# Patient Record
Sex: Female | Born: 1990 | Race: White | Hispanic: No | Marital: Married | Smoking: Never smoker
Health system: Southern US, Community
[De-identification: ages and names within clinical notes are randomized; demographics above are authoritative.]

## PROBLEM LIST (undated history)

## (undated) ENCOUNTER — Inpatient Hospital Stay (HOSPITAL_COMMUNITY): Payer: Self-pay

## (undated) DIAGNOSIS — I499 Cardiac arrhythmia, unspecified: Secondary | ICD-10-CM

## (undated) DIAGNOSIS — F419 Anxiety disorder, unspecified: Secondary | ICD-10-CM

## (undated) DIAGNOSIS — Z789 Other specified health status: Secondary | ICD-10-CM

## (undated) DIAGNOSIS — K219 Gastro-esophageal reflux disease without esophagitis: Secondary | ICD-10-CM

## (undated) HISTORY — PX: TYMPANOSTOMY TUBE PLACEMENT: SHX32

## (undated) HISTORY — PX: NO PAST SURGERIES: SHX2092

## (undated) HISTORY — DX: Cardiac arrhythmia, unspecified: I49.9

## (undated) HISTORY — DX: Gastro-esophageal reflux disease without esophagitis: K21.9

## (undated) HISTORY — DX: Anxiety disorder, unspecified: F41.9

---
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
## 2016-01-27 ENCOUNTER — Other Ambulatory Visit (HOSPITAL_COMMUNITY): Payer: Self-pay | Admitting: Nurse Practitioner

## 2016-01-27 ENCOUNTER — Ambulatory Visit (HOSPITAL_COMMUNITY)
Admission: RE | Admit: 2016-01-27 | Discharge: 2016-01-27 | Disposition: A | Discharge status: Home / Self Care | Payer: BC Managed Care – PPO | Source: Ambulatory Visit | Attending: Nurse Practitioner | Admitting: Nurse Practitioner

## 2016-01-27 DIAGNOSIS — R0789 Other chest pain: Secondary | ICD-10-CM | POA: Insufficient documentation

## 2016-01-28 ENCOUNTER — Other Ambulatory Visit (HOSPITAL_COMMUNITY): Payer: Self-pay | Admitting: Nurse Practitioner

## 2016-04-25 ENCOUNTER — Emergency Department (HOSPITAL_COMMUNITY)
Admission: EM | Admit: 2016-04-25 | Discharge: 2016-04-25 | Disposition: A | Discharge status: Home / Self Care | Payer: BC Managed Care – PPO | Attending: Emergency Medicine | Admitting: Emergency Medicine

## 2016-04-25 ENCOUNTER — Emergency Department (HOSPITAL_COMMUNITY): Payer: BC Managed Care – PPO

## 2016-04-25 ENCOUNTER — Encounter (HOSPITAL_COMMUNITY): Payer: Self-pay | Admitting: Emergency Medicine

## 2016-04-25 DIAGNOSIS — Z3A01 Less than 8 weeks gestation of pregnancy: Secondary | ICD-10-CM | POA: Diagnosis not present

## 2016-04-25 DIAGNOSIS — O209 Hemorrhage in early pregnancy, unspecified: Secondary | ICD-10-CM | POA: Diagnosis not present

## 2016-04-25 LAB — URINALYSIS, ROUTINE W REFLEX MICROSCOPIC
BILIRUBIN URINE: NEGATIVE
Glucose, UA: NEGATIVE mg/dL
Ketones, ur: NEGATIVE mg/dL
NITRITE: NEGATIVE
Protein, ur: NEGATIVE mg/dL
SPECIFIC GRAVITY, URINE: 1.022 (ref 1.005–1.030)
pH: 5.5 (ref 5.0–8.0)

## 2016-04-25 LAB — CBC WITH DIFFERENTIAL/PLATELET
BASOS ABS: 0 10*3/uL (ref 0.0–0.1)
BASOS PCT: 0 %
EOS ABS: 0 10*3/uL (ref 0.0–0.7)
Eosinophils Relative: 0 %
HCT: 37.7 % (ref 36.0–46.0)
HEMOGLOBIN: 13.3 g/dL (ref 12.0–15.0)
Lymphocytes Relative: 32 %
Lymphs Abs: 2.2 10*3/uL (ref 0.7–4.0)
MCH: 31.1 pg (ref 26.0–34.0)
MCHC: 35.3 g/dL (ref 30.0–36.0)
MCV: 88.1 fL (ref 78.0–100.0)
MONO ABS: 0.6 10*3/uL (ref 0.1–1.0)
Monocytes Relative: 9 %
NEUTROS ABS: 4 10*3/uL (ref 1.7–7.7)
NEUTROS PCT: 59 %
Platelets: 230 10*3/uL (ref 150–400)
RBC: 4.28 MIL/uL (ref 3.87–5.11)
RDW: 12.3 % (ref 11.5–15.5)
WBC: 6.8 10*3/uL (ref 4.0–10.5)

## 2016-04-25 LAB — URINE MICROSCOPIC-ADD ON

## 2016-04-25 LAB — ABO/RH: ABO/RH(D): A POS

## 2016-04-25 LAB — WET PREP, GENITAL
CLUE CELLS WET PREP: NONE SEEN
Sperm: NONE SEEN
TRICH WET PREP: NONE SEEN
YEAST WET PREP: NONE SEEN

## 2016-04-25 LAB — BASIC METABOLIC PANEL
ANION GAP: 5 (ref 5–15)
BUN: 10 mg/dL (ref 6–20)
CALCIUM: 9.2 mg/dL (ref 8.9–10.3)
CO2: 24 mmol/L (ref 22–32)
CREATININE: 0.7 mg/dL (ref 0.44–1.00)
Chloride: 110 mmol/L (ref 101–111)
Glucose, Bld: 97 mg/dL (ref 65–99)
Potassium: 3.7 mmol/L (ref 3.5–5.1)
SODIUM: 139 mmol/L (ref 135–145)

## 2016-04-25 LAB — HCG, QUANTITATIVE, PREGNANCY: hCG, Beta Chain, Quant, S: 3094 m[IU]/mL — ABNORMAL HIGH (ref <5)

## 2016-04-25 NOTE — ED Provider Notes (Signed)
CSN: [id number]     Arrival date & time 04/25/16  [id number] History   First MD Initiated Contact with Patient 04/25/16 0602     Chief Complaint  Patient presents with  . Vaginal Bleeding     (Consider location/radiation/quality/duration/timing/severity/associated sxs/prior Treatment) The history is provided by the patient.     G1P0 pt with LMP May 15 presents with vaginal bleeding and lower abdominal cramping that began last night.  The symptoms began around 8pm.  Cramping became intense, 10/10 around 12:30am and bleeding increased.  Denies fevers, vomiting, urinary or bowel symptoms.  Will see OB Dr [facility] at [facility] in July.    History reviewed. No pertinent past medical history. History reviewed. No pertinent past surgical history. No family history on file. Social History  Substance Use Topics  . Smoking status: Never Smoker   . Smokeless tobacco: None  . Alcohol Use: No   OB History    No data available     Review of Systems  All other systems reviewed and are negative.     Allergies  Review of patient's allergies indicates no known allergies.  Home Medications   Prior to Admission medications   Medication Sig Start Date End Date Taking? Authorizing Provider  Aspirin-Acetaminophen (GOODYS BODY PAIN PO) Take 1 each by mouth daily as needed (menstrual pains.).   Yes Historical Provider, MD  Prenatal Vit-Fe Fumarate-FA (PRENATAL MULTIVITAMIN) TABS tablet Take 1 tablet by mouth daily at 12 noon.   Yes Historical Provider, MD   BP 133/78 mmHg  Pulse 118  Temp(Src) 98.3 F (36.8 C) (Oral)  Resp 18  SpO2 100%  LMP 03/16/2016 Physical Exam  Constitutional: She appears well-developed and well-nourished. No distress.  HENT:  Head: Normocephalic and atraumatic.  Neck: Neck supple.  Cardiovascular: Normal rate and regular rhythm.   Pulmonary/Chest: Effort normal and breath sounds normal. No respiratory distress. She has no wheezes. She has no rales.   Abdominal: Soft. She exhibits no distension. There is no tenderness. There is no rebound and no guarding.  Genitourinary:  Moderate amount of dark blood in vagina.  Os is closed.  NO masses or tenderness noted on bimanual exam.    Neurological: She is alert.  Skin: She is not diaphoretic.  Nursing note and vitals reviewed.   ED Course  Procedures (including critical care time) Labs Review Labs Reviewed  WET PREP, GENITAL - Abnormal; Notable for the following:    WBC, Wet Prep HPF POC MODERATE (*)    All other components within normal limits  URINALYSIS, ROUTINE W REFLEX MICROSCOPIC (NOT AT [facility]) - Abnormal; Notable for the following:    APPearance CLOUDY (*)    Hgb urine dipstick LARGE (*)    Leukocytes, UA SMALL (*)    All other components within normal limits  HCG, QUANTITATIVE, PREGNANCY - Abnormal; Notable for the following:    hCG, Beta Chain, Quant, S 3094 (*)    All other components within normal limits  URINE MICROSCOPIC-ADD ON - Abnormal; Notable for the following:    Squamous Epithelial / LPF 0-5 (*)    Bacteria, UA FEW (*)    All other components within normal limits  BASIC METABOLIC PANEL  CBC WITH DIFFERENTIAL/PLATELET  ABO/RH  GC/CHLAMYDIA PROBE AMP () NOT AT [facility]    Imaging Review No results found. I have personally reviewed and evaluated these images and lab results as part of my medical decision-making.   EKG Interpretation None  MDM   Final diagnoses:  Vaginal bleeding in pregnancy, first trimester    Afebrile, nontoxic patient with pain and bleeding in early pregnancy. Os is closed.  Moderate amount of blood in vaginal vault with small amount of continued blood and mucus through cervix.  Suspect most likely spontaneous abortion though ectopic not ruled out.  US does not show pregnancy or other abnormality (with exception of noted nabothian cyst on cervix, unrelated).  Blood type A positive, rhogam not indicated.   D/C home with  recheck in 2 days with her OB or at [facility].   Pt understands to also go to [facility] directly with any worsening symptoms.  Discussed results, findings, treatment, and follow up  with patient.  Pt given return precautions.  Pt verbalizes understanding and agrees with plan.         Trixie Dredgemily Cherylyn Sundby, PA-C 04/25/16 1213  April Palumbo, MD 04/25/16 [phone]

## 2016-04-25 NOTE — ED Notes (Signed)
Unable to collect labs Pt refused RN Nicola GirtJohnathan made aware.

## 2016-04-25 NOTE — Discharge Instructions (Signed)
Read the information below.  You may return to the Emergency Department at any time for worsening condition or any new symptoms that concern you.  If you develop high fevers, worsening abdominal pain, uncontrolled vomiting, or are unable to tolerate fluids by mouth, go directly to [facility] for a recheck.    Your ultrasound did not show any signs of pregnancy at this time, though your HCG quant (pregnancy test) was 3094.  It is likely that you have either miscarried, or possibly have an ectopic pregnancy (pregnancy in the fallopian tube or other location where it does not belong).  It is extremely important that you follow up with your OB or [facility] in two days to be rechecked.  If your bleeding or pain increases in the meantime, please go directly to [facility] for evaluation.    Vaginal Bleeding During Pregnancy, First Trimester A small amount of bleeding (spotting) from the vagina is relatively common in early pregnancy. It usually stops on its own. Various things may cause bleeding or spotting in early pregnancy. Some bleeding may be related to the pregnancy, and some may not. In most cases, the bleeding is normal and is not a problem. However, bleeding can also be a sign of something serious. Be sure to tell your health care provider about any vaginal bleeding right away. Some possible causes of vaginal bleeding during the first trimester include:  Infection or inflammation of the cervix.  Growths (polyps) on the cervix.  Miscarriage or threatened miscarriage.  Pregnancy tissue has developed outside of the uterus and in a fallopian tube (tubal pregnancy).  Tiny cysts have developed in the uterus instead of pregnancy tissue (molar pregnancy). HOME CARE INSTRUCTIONS  Watch your condition for any changes. The following actions may help to lessen any discomfort you are feeling:  Follow your health care provider's instructions for limiting your activity. If your health care  provider orders bed rest, you may need to stay in bed and only get up to use the bathroom. However, your health care provider may allow you to continue light activity.  If needed, make plans for someone to help with your regular activities and responsibilities while you are on bed rest.  Keep track of the number of pads you use each day, how often you change pads, and how soaked (saturated) they are. Write this down.  Do not use tampons. Do not douche.  Do not have sexual intercourse or orgasms until approved by your health care provider.  If you pass any tissue from your vagina, save the tissue so you can show it to your health care provider.  Only take over-the-counter or prescription medicines as directed by your health care provider.  Do not take aspirin because it can make you bleed.  Keep all follow-up appointments as directed by your health care provider. SEEK MEDICAL CARE IF:  You have any vaginal bleeding during any part of your pregnancy.  You have cramps or labor pains.  You have a fever, not controlled by medicine. SEEK IMMEDIATE MEDICAL CARE IF:   You have severe cramps in your back or belly (abdomen).  You pass large clots or tissue from your vagina.  Your bleeding increases.  You feel light-headed or weak, or you have fainting episodes.  You have chills.  You are leaking fluid or have a gush of fluid from your vagina.  You pass out while having a bowel movement. MAKE SURE YOU:  Understand these instructions.  Will watch your condition.  Will get help right away if you are not doing well or get worse.   This information is not intended to replace advice given to you by your health care provider. Make sure you discuss any questions you have with your health care provider.   Document Released: 07/29/2005 Document Revised: 10/24/2013 Document Reviewed: 06/26/2013 Elsevier Interactive Patient Education 2016 Elsevier Inc.  Pelvic Rest Pelvic rest is  sometimes recommended for women when:   The placenta is partially or completely covering the opening of the cervix (placenta previa).  There is bleeding between the uterine wall and the amniotic sac in the first trimester (subchorionic hemorrhage).  The cervix begins to open without labor starting (incompetent cervix, cervical insufficiency).  The labor is too early (preterm labor). HOME CARE INSTRUCTIONS  Do not have sexual intercourse, stimulation, or an orgasm.  Do not use tampons, douche, or put anything in the vagina.  Do not lift anything over 10 pounds (4.5 kg).  Avoid strenuous activity or straining your pelvic muscles. SEEK MEDICAL CARE IF:  You have any vaginal bleeding during pregnancy. Treat this as a potential emergency.  You have cramping pain felt low in the stomach (stronger than menstrual cramps).  You notice vaginal discharge (watery, mucus, or bloody).  You have a low, dull backache.  There are regular contractions or uterine tightening. SEEK IMMEDIATE MEDICAL CARE IF: You have vaginal bleeding and have placenta previa.    This information is not intended to replace advice given to you by your health care provider. Make sure you discuss any questions you have with your health care provider.   Document Released: 02/13/2011 Document Revised: 01/11/2012 Document Reviewed: 04/22/2015 Elsevier Interactive Patient Education [employer] Elsevier Inc.

## 2016-04-25 NOTE — ED Notes (Signed)
Pt states she began spotting yesterday, heavier vaginal bleeding with clots and abdominal cramping began @ 0100. Pt states she has taken 7 pregnancy test all positive, pt has appt with OB/GYN July 10th.

## 2016-04-27 ENCOUNTER — Encounter (HOSPITAL_COMMUNITY): Payer: Self-pay | Admitting: *Deleted

## 2016-04-27 ENCOUNTER — Inpatient Hospital Stay (HOSPITAL_COMMUNITY)
Admission: AD | Admit: 2016-04-27 | Discharge: 2016-04-27 | Disposition: A | Discharge status: Home / Self Care | Payer: BC Managed Care – PPO | Source: Ambulatory Visit | Attending: Obstetrics and Gynecology | Admitting: Obstetrics and Gynecology

## 2016-04-27 DIAGNOSIS — Z09 Encounter for follow-up examination after completed treatment for conditions other than malignant neoplasm: Secondary | ICD-10-CM | POA: Insufficient documentation

## 2016-04-27 DIAGNOSIS — O039 Complete or unspecified spontaneous abortion without complication: Secondary | ICD-10-CM

## 2016-04-27 LAB — HCG, QUANTITATIVE, PREGNANCY: HCG, BETA CHAIN, QUANT, S: 507 m[IU]/mL — AB (ref <5)

## 2016-04-27 LAB — GC/CHLAMYDIA PROBE AMP (~~LOC~~) NOT AT ARMC
CHLAMYDIA, DNA PROBE: NEGATIVE
NEISSERIA GONORRHEA: NEGATIVE

## 2016-04-27 NOTE — Discharge Instructions (Signed)
Miscarriage  A miscarriage is the sudden loss of an unborn baby (fetus) before the 20th week of pregnancy. Most miscarriages happen in the first 3 months of pregnancy. Sometimes, it happens before a woman even knows she is pregnant. A miscarriage is also called a "spontaneous miscarriage" or "early pregnancy loss." Having a miscarriage can be an emotional experience. Talk with your caregiver about any questions you may have about miscarrying, the grieving process, and your future pregnancy plans.  CAUSES    Problems with the fetal chromosomes that make it impossible for the baby to develop normally. Problems with the baby's genes or chromosomes are most often the result of errors that occur, by chance, as the embryo divides and grows. The problems are not inherited from the parents.   Infection of the cervix or uterus.    Hormone problems.    Problems with the cervix, such as having an incompetent cervix. This is when the tissue in the cervix is not strong enough to hold the pregnancy.    Problems with the uterus, such as an abnormally shaped uterus, uterine fibroids, or congenital abnormalities.    Certain medical conditions.    Smoking, drinking alcohol, or taking illegal drugs.    Trauma.   Often, the cause of a miscarriage is unknown.   SYMPTOMS    Vaginal bleeding or spotting, with or without cramps or pain.   Pain or cramping in the abdomen or lower back.   Passing fluid, tissue, or blood clots from the vagina.  DIAGNOSIS   Your caregiver will perform a physical exam. You may also have an ultrasound to confirm the miscarriage. Blood or urine tests may also be ordered.  TREATMENT    Sometimes, treatment is not necessary if you naturally pass all the fetal tissue that was in the uterus. If some of the fetus or placenta remains in the body (incomplete miscarriage), tissue left behind may become infected and must be removed. Usually, a dilation and curettage (D and C) procedure is performed.  During a D and C procedure, the cervix is widened (dilated) and any remaining fetal or placental tissue is gently removed from the uterus.   Antibiotic medicines are prescribed if there is an infection. Other medicines may be given to reduce the size of the uterus (contract) if there is a lot of bleeding.   If you have Rh negative blood and your baby was Rh positive, you will need a Rh immunoglobulin shot. This shot will protect any future baby from having Rh blood problems in future pregnancies.  HOME CARE INSTRUCTIONS    Your caregiver may order bed rest or may allow you to continue light activity. Resume activity as directed by your caregiver.   Have someone help with home and family responsibilities during this time.    Keep track of the number of sanitary pads you use each day and how soaked (saturated) they are. Write down this information.    Do not use tampons. Do not douche or have sexual intercourse until approved by your caregiver.    Only take over-the-counter or prescription medicines for pain or discomfort as directed by your caregiver.    Do not take aspirin. Aspirin can cause bleeding.    Keep all follow-up appointments with your caregiver.    If you or your partner have problems with grieving, talk to your caregiver or seek counseling to help cope with the pregnancy loss. Allow enough time to grieve before trying to get pregnant again.     SEEK IMMEDIATE MEDICAL CARE IF:    You have severe cramps or pain in your back or abdomen.   You have a fever.   You pass large blood clots (walnut-sized or larger) ortissue from your vagina. Save any tissue for your caregiver to inspect.    Your bleeding increases.    You have a thick, bad-smelling vaginal discharge.   You become lightheaded, weak, or you faint.    You have chills.   MAKE SURE YOU:   Understand these instructions.   Will watch your condition.   Will get help right away if you are not doing well or get worse.     This  information is not intended to replace advice given to you by your health care provider. Make sure you discuss any questions you have with your health care provider.     Document Released: 04/14/2001 Document Revised: 02/13/2013 Document Reviewed: 12/08/2011  Elsevier Interactive Patient Education 2016 Elsevier Inc.

## 2016-04-27 NOTE — MAU Provider Note (Signed)
  History     CSN: [id number]  Arrival date and time: 04/27/16 1546   None     Chief Complaint  Patient presents with  . Follow-up   HPI Maria Morales is 25 y.o. G1P0 [id number] weeks presenting for follow up after [facility] bleeding and cramping that began on day of visit, 04/25/16.in early pregnancy. She has a new patient appt with  [facility] for [facility].  On that date BHCG was 3094, ABO RH A positive, and U/S showed no evidence of IUGS and no ovarian or adnexal masses that are suspicious.  U/S neg for Free fluid.  Bleeding has slowed-dark brown yesterday and did not see any here when she went to the restroom.  This was a planned pregnancy.  No past medical history on file.  No past surgical history on file.  No family history on file.  Social History  Substance Use Topics  . Smoking status: Never Smoker   . Smokeless tobacco: Not on file  . Alcohol Use: No    Allergies: No Known Allergies  Prescriptions prior to admission  Medication Sig Dispense Refill Last Dose  . Aspirin-Acetaminophen (GOODYS BODY PAIN PO) Take 1 each by mouth daily as needed (menstrual pains.).   a month  . Prenatal Vit-Fe Fumarate-FA (PRENATAL MULTIVITAMIN) TABS tablet Take 1 tablet by mouth daily at 12 noon.   04/24/2016 at Unknown time    Review of Systems  Constitutional: Negative for fever and chills.  Gastrointestinal: Negative for abdominal pain.  Genitourinary:       Neg for bleeding today. Dark brown discharge yesterday   Physical Exam   Blood pressure 125/78, pulse 84, temperature 98.2 F (36.8 C), temperature source Oral, resp. rate 18, height 5\' 4"  (1.626 m), weight 138 lb (62.596 kg), last menstrual period 03/16/2016.  Physical Exam  Constitutional: She is oriented to person, place, and time. She appears well-developed and well-nourished. No distress.  HENT:  Head: Normocephalic.  Cardiovascular: Normal rate and regular rhythm.   Neurological: She is alert and  oriented to person, place, and time.  Psychiatric: She has a normal mood and affect. Her behavior is normal.   Pelvic exam not indicated for this visit.   Results for orders placed or performed during the hospital encounter of 04/27/16 (from the past 24 hour(s))  hCG, quantitative, pregnancy     Status: Abnormal   Collection Time: 04/27/16  4:16 PM  Result Value Ref Range   hCG, Beta Chain, Quant, S 507 (H) <5 mIU/mL   MAU Course  Procedures  MDM MSE Labs  Assessment and Plan   A:  Follow up visit for cramping and bleeding in the first trimester ([facility])      Falling BHCG levels      Spontaneous Abortion-first trimester  P:  Gave patient a time to return to CLINIC for repeat BHCG for 7/3 at 11:00am.  She will call the clinic and cancel if [facility] will       see her for follow up.  Return for pain or heavy bleeding.      Continue prenatal vitamins-plan another pregnancy soon. KEY,EVE M 04/27/2016, 5:19 PM

## 2016-04-27 NOTE — MAU Note (Signed)
Pt here for repeat labs, was seen @ [facility] 2 days ago.  Denies abd pain today, has bleeding - has changed one pad today, was not saturated, less bleeding than 2 days ago.

## 2016-05-04 ENCOUNTER — Other Ambulatory Visit: Payer: BC Managed Care – PPO

## 2016-05-04 DIAGNOSIS — O039 Complete or unspecified spontaneous abortion without complication: Secondary | ICD-10-CM

## 2016-05-05 LAB — HCG, QUANTITATIVE, PREGNANCY: HCG, BETA CHAIN, QUANT, S: 51.5 m[IU]/mL — AB

## 2016-05-07 ENCOUNTER — Telehealth: Payer: Self-pay | Admitting: *Deleted

## 2016-05-07 NOTE — Telephone Encounter (Signed)
Maria Morales called yesterday afternoon twice and left message wanting to know blood results- had miscarriage.

## 2016-05-08 NOTE — Telephone Encounter (Signed)
Pt called in for her lab results and was given BHCG test result from 7/3. I explained that she will need to have a repeat test done in 7-10 days from last BHCG as we need to follow the hormone level until it is <5. Pt voiced understanding and stated that she has appt w/Dr. Dareen PianoAnderson on Monday 7/10 and will follow up at that office.  Pt had no further questions.

## 2016-05-11 ENCOUNTER — Other Ambulatory Visit: Payer: Self-pay | Admitting: Obstetrics and Gynecology

## 2016-05-12 LAB — CYTOLOGY - PAP

## 2016-07-07 ENCOUNTER — Inpatient Hospital Stay (HOSPITAL_COMMUNITY): Payer: BC Managed Care – PPO

## 2016-07-07 ENCOUNTER — Inpatient Hospital Stay (HOSPITAL_COMMUNITY)
Admission: AD | Admit: 2016-07-07 | Discharge: 2016-07-07 | Disposition: A | Discharge status: Home / Self Care | Payer: BC Managed Care – PPO | Source: Ambulatory Visit | Attending: Obstetrics and Gynecology | Admitting: Obstetrics and Gynecology

## 2016-07-07 ENCOUNTER — Encounter (HOSPITAL_COMMUNITY): Payer: Self-pay

## 2016-07-07 DIAGNOSIS — O2341 Unspecified infection of urinary tract in pregnancy, first trimester: Secondary | ICD-10-CM | POA: Diagnosis not present

## 2016-07-07 DIAGNOSIS — N390 Urinary tract infection, site not specified: Secondary | ICD-10-CM

## 2016-07-07 DIAGNOSIS — O26891 Other specified pregnancy related conditions, first trimester: Secondary | ICD-10-CM | POA: Insufficient documentation

## 2016-07-07 DIAGNOSIS — R102 Pelvic and perineal pain: Secondary | ICD-10-CM | POA: Insufficient documentation

## 2016-07-07 DIAGNOSIS — Z3A01 Less than 8 weeks gestation of pregnancy: Secondary | ICD-10-CM | POA: Insufficient documentation

## 2016-07-07 LAB — WET PREP, GENITAL
Clue Cells Wet Prep HPF POC: NONE SEEN
Sperm: NONE SEEN
Trich, Wet Prep: NONE SEEN
Yeast Wet Prep HPF POC: NONE SEEN

## 2016-07-07 LAB — URINALYSIS, ROUTINE W REFLEX MICROSCOPIC
BILIRUBIN URINE: NEGATIVE
GLUCOSE, UA: NEGATIVE mg/dL
KETONES UR: 15 mg/dL — AB
Nitrite: POSITIVE — AB
PH: 6 (ref 5.0–8.0)
Protein, ur: NEGATIVE mg/dL
Specific Gravity, Urine: 1.015 (ref 1.005–1.030)

## 2016-07-07 LAB — CBC
HCT: 36.7 % (ref 36.0–46.0)
Hemoglobin: 13.3 g/dL (ref 12.0–15.0)
MCH: 31.2 pg (ref 26.0–34.0)
MCHC: 36.2 g/dL — ABNORMAL HIGH (ref 30.0–36.0)
MCV: 86.2 fL (ref 78.0–100.0)
PLATELETS: 198 10*3/uL (ref 150–400)
RBC: 4.26 MIL/uL (ref 3.87–5.11)
RDW: 12.7 % (ref 11.5–15.5)
WBC: 8 10*3/uL (ref 4.0–10.5)

## 2016-07-07 LAB — URINE MICROSCOPIC-ADD ON: RBC / HPF: NONE SEEN RBC/hpf (ref 0–5)

## 2016-07-07 LAB — HCG, QUANTITATIVE, PREGNANCY: HCG, BETA CHAIN, QUANT, S: 28455 m[IU]/mL — AB (ref <5)

## 2016-07-07 LAB — GC/CHLAMYDIA PROBE AMP (~~LOC~~) NOT AT ARMC
CHLAMYDIA, DNA PROBE: NEGATIVE
NEISSERIA GONORRHEA: NEGATIVE

## 2016-07-07 LAB — POCT PREGNANCY, URINE: PREG TEST UR: POSITIVE — AB

## 2016-07-07 MED ORDER — NITROFURANTOIN MONOHYD MACRO 100 MG PO CAPS: 100.0000 mg | ORAL_CAPSULE | Freq: Two times a day (BID) | ORAL | 0 refills | Status: DC

## 2016-07-07 MED ORDER — PROMETHAZINE HCL 25 MG PO TABS: 12.5000 mg | ORAL_TABLET | Freq: Four times a day (QID) | ORAL | 0 refills | Status: DC | PRN

## 2016-07-07 NOTE — MAU Provider Note (Signed)
History     CSN: [id number]  Arrival date and time: 07/07/16 0246   First Provider Initiated Contact with Patient 07/07/16 0302      Chief Complaint  Patient presents with  . Pelvic Pain   Maria Morales is  25 y.o. G2P0 at [redacted]w[redacted]d who presents today with cramping. She states that she awoke around 0100, and had cramping that she states felt similar to her last miscarriage. She denies any vaginal bleeding. She states that the pain is gone now, but she still feels "achy".    Pelvic Pain  The patient's primary symptoms include pelvic pain. This is a new problem. The current episode started today. The problem occurs intermittently. The problem has been resolved. The patient is experiencing no pain (No pain now, but when she awoke around 0100 she had pain that "felt like my last miscarriage". ). The problem affects both sides. She is pregnant. Associated symptoms include abdominal pain and nausea. Pertinent negatives include no chills, constipation, diarrhea, dysuria, fever, frequency, urgency or vomiting. Nothing aggravates the symptoms. She has tried nothing for the symptoms. Her menstrual history has been regular (LMP 05/26/16 ).     No past medical history on file.  No past surgical history on file.  No family history on file.  Social History  Substance Use Topics  . Smoking status: Never Smoker  . Smokeless tobacco: Not on file  . Alcohol use No    Allergies: No Known Allergies  Prescriptions Prior to Admission  Medication Sig Dispense Refill Last Dose  . Prenatal Vit-Fe Fumarate-FA (PRENATAL MULTIVITAMIN) TABS tablet Take 1 tablet by mouth daily at 12 noon.   04/24/2016 at Unknown time    Review of Systems  Constitutional: Negative for chills and fever.  Gastrointestinal: Positive for abdominal pain and nausea. Negative for constipation, diarrhea and vomiting.  Genitourinary: Positive for pelvic pain. Negative for dysuria, frequency and urgency.   Physical Exam   unknown  if currently breastfeeding.  Physical Exam  Nursing note and vitals reviewed. Constitutional: She is oriented to person, place, and time. She appears well-developed and well-nourished. No distress.  HENT:  Head: Normocephalic.  Cardiovascular: Normal rate.   Respiratory: Effort normal.  GI: Soft. There is no tenderness. There is no rebound.  Neurological: She is alert and oriented to person, place, and time.  Skin: Skin is warm and dry.  Psychiatric: She has a normal mood and affect.   Results for orders placed or performed during the hospital encounter of 07/07/16 (from the past 24 hour(s))  Urinalysis, Routine w reflex microscopic (not at [facility])     Status: Abnormal   Collection Time: 07/07/16  2:54 AM  Result Value Ref Range   Color, Urine YELLOW YELLOW   APPearance HAZY (A) CLEAR   Specific Gravity, Urine 1.015 1.005 - 1.030   pH 6.0 5.0 - 8.0   Glucose, UA NEGATIVE NEGATIVE mg/dL   Hgb urine dipstick TRACE (A) NEGATIVE   Bilirubin Urine NEGATIVE NEGATIVE   Ketones, ur 15 (A) NEGATIVE mg/dL   Protein, ur NEGATIVE NEGATIVE mg/dL   Nitrite POSITIVE (A) NEGATIVE   Leukocytes, UA SMALL (A) NEGATIVE  Urine microscopic-add on     Status: Abnormal   Collection Time: 07/07/16  2:54 AM  Result Value Ref Range   Squamous Epithelial / LPF 0-5 (A) NONE SEEN   WBC, UA 6-30 0 - 5 WBC/hpf   RBC / HPF NONE SEEN 0 - 5 RBC/hpf   Bacteria, UA MANY (A)  NONE SEEN  Pregnancy, urine POC     Status: Abnormal   Collection Time: 07/07/16  3:00 AM  Result Value Ref Range   Preg Test, Ur POSITIVE (A) NEGATIVE  Wet prep, genital     Status: Abnormal   Collection Time: 07/07/16  3:05 AM  Result Value Ref Range   Yeast Wet Prep HPF POC NONE SEEN NONE SEEN   Trich, Wet Prep NONE SEEN NONE SEEN   Clue Cells Wet Prep HPF POC NONE SEEN NONE SEEN   WBC, Wet Prep HPF POC MODERATE (A) NONE SEEN   Sperm NONE SEEN   CBC     Status: Abnormal   Collection Time: 07/07/16  3:08 AM  Result Value Ref Range    WBC 8.0 4.0 - 10.5 K/uL   RBC 4.26 3.87 - 5.11 MIL/uL   Hemoglobin 13.3 12.0 - 15.0 g/dL   HCT 40.936.7 81.136.0 - 91.446.0 %   MCV 86.2 78.0 - 100.0 fL   MCH 31.2 26.0 - 34.0 pg   MCHC 36.2 (H) 30.0 - 36.0 g/dL   RDW 78.212.7 95.611.5 - 21.315.5 %   Platelets 198 150 - 400 K/uL   [place] Ob Comp Less 14 Wks  Result Date: 07/07/2016 CLINICAL DATA:  Acute onset of pelvic pain.  Initial encounter. EXAM: OBSTETRIC <14 WK US AND TRANSVAGINAL OB US TECHNIQUE: Both transabdominal and transvaginal ultrasound examinations were performed for complete evaluation of the gestation as well as the maternal uterus, adnexal regions, and pelvic cul-de-sac. Transvaginal technique was performed to assess early pregnancy. COMPARISON:  Pelvic ultrasound performed 04/25/2016 FINDINGS: Intrauterine gestational sac: Single; visualized and normal in shape. Yolk sac:  Yes Embryo:  Yes Cardiac Activity: Yes Heart Rate: 78  bpm MSD: 15.0  mm   6 w   2  d CRL:  1.4 mm    Still too small to further characterize. Subchorionic hemorrhage:  None visualized. Maternal uterus/adnexae: The uterus is otherwise unremarkable. The ovaries are within normal limits. The right ovary measures 3.3 x 3.1 x 2.5 cm, while the left ovary measures 3.1 x 1.7 x 1.9 cm. No suspicious adnexal masses are seen; there is no evidence for ovarian torsion. No free fluid is seen within the pelvic cul-de-sac. IMPRESSION: Single live intrauterine pregnancy noted, with a mean sac diameter of 1.5 cm, and a crown-rump length of 1.4 mm. The gestational age by mean sac diameter matches the gestational age by LMP of 6 weeks 0 days, reflecting an estimated date of delivery of Mar 02, 2017. Electronically Signed   By: Roanna RaiderJeffery  Chang M.D.   On: 07/07/2016 03:54   [place] Ob Transvaginal  Result Date: 07/07/2016 CLINICAL DATA:  Acute onset of pelvic pain.  Initial encounter. EXAM: OBSTETRIC <14 WK US AND TRANSVAGINAL OB US TECHNIQUE: Both transabdominal and transvaginal ultrasound examinations were performed  for complete evaluation of the gestation as well as the maternal uterus, adnexal regions, and pelvic cul-de-sac. Transvaginal technique was performed to assess early pregnancy. COMPARISON:  Pelvic ultrasound performed 04/25/2016 FINDINGS: Intrauterine gestational sac: Single; visualized and normal in shape. Yolk sac:  Yes Embryo:  Yes Cardiac Activity: Yes Heart Rate: 78  bpm MSD: 15.0  mm   6 w   2  d CRL:  1.4 mm    Still too small to further characterize. Subchorionic hemorrhage:  None visualized. Maternal uterus/adnexae: The uterus is otherwise unremarkable. The ovaries are within normal limits. The right ovary measures 3.3 x 3.1 x 2.5 cm, while the left ovary  measures 3.1 x 1.7 x 1.9 cm. No suspicious adnexal masses are seen; there is no evidence for ovarian torsion. No free fluid is seen within the pelvic cul-de-sac. IMPRESSION: Single live intrauterine pregnancy noted, with a mean sac diameter of 1.5 cm, and a crown-rump length of 1.4 mm. The gestational age by mean sac diameter matches the gestational age by LMP of 6 weeks 0 days, reflecting an estimated date of delivery of Mar 02, 2017. Electronically Signed   By: Roanna Raider M.D.   On: 07/07/2016 03:54    MAU Course  Procedures  MDM  Assessment and Plan   1. Urinary tract infection, site not specified   2. Pelvic pain in pregnancy, antepartum, first trimester   3. [redacted] weeks gestation of pregnancy    DC home Comfort measures reviewed  1st Trimester precautions  WJ:[id number] BID x5 days, phenergan PRN #30  Return to MAU as needed FU with OB as planned  Follow-up Information    Levi Aland, MD .   Specialty:  Obstetrics and Gynecology Contact information: [address] [place] [address] [phone]            Tawnya Crook 07/07/2016, 3:03 AM

## 2016-07-07 NOTE — Discharge Instructions (Signed)
Pregnancy and Urinary Tract Infection  A urinary tract infection (UTI) is a bacterial infection of the urinary tract. Infection of the urinary tract can include the ureters, kidneys (pyelonephritis), bladder (cystitis), and urethra (urethritis). All pregnant women should be screened for bacteria in the urinary tract. Identifying and treating a UTI will decrease the risk of preterm labor and developing more serious infections in both the mother and baby.  CAUSES  Bacteria germs cause almost all UTIs.   RISK FACTORS  Many factors can increase your chances of getting a UTI during pregnancy. These include:  · Having a short urethra.  · Poor toilet and hygiene habits.  · Sexual intercourse.  · Blockage of urine along the urinary tract.  · Problems with the pelvic muscles or nerves.  · Diabetes.  · Obesity.  · Bladder problems after having several children.  · Previous history of UTI.  SIGNS AND SYMPTOMS   · Pain, burning, or a stinging feeling when urinating.  · Suddenly feeling the need to urinate right away (urgency).  · Loss of bladder control (urinary incontinence).  · Frequent urination, more than is common with pregnancy.  · Lower abdominal or back discomfort.  · Cloudy urine.  · Blood in the urine (hematuria).  · Fever.   When the kidneys are infected, the symptoms may be:  · Back pain.  · Flank pain on the right side more so than the left.  · Fever.  · Chills.  · Nausea.  · Vomiting.  DIAGNOSIS   A urinary tract infection is usually diagnosed through urine tests. Additional tests and procedures are sometimes done. These may include:  · Ultrasound exam of the kidneys, ureters, bladder, and urethra.  · Looking in the bladder with a lighted tube (cystoscopy).  TREATMENT  Typically, UTIs can be treated with antibiotic medicines.   HOME CARE INSTRUCTIONS   · Only take over-the-counter or prescription medicines as directed by your health care provider. If you were prescribed antibiotics, take them as directed. Finish  them even if you start to feel better.  · Drink enough fluids to keep your urine clear or pale yellow.  · Do not have sexual intercourse until the infection is gone and your health care provider says it is okay.  · Make sure you are tested for UTIs throughout your pregnancy. These infections often come back.   Preventing a UTI in the Future  · Practice good toilet habits. Always wipe from front to back. Use the tissue only once.  · Do not hold your urine. Empty your bladder as soon as possible when the urge comes.  · Do not douche or use deodorant sprays.  · Wash with soap and warm water around the genital area and the anus.  · Empty your bladder before and after sexual intercourse.  · Wear underwear with a cotton crotch.  · Avoid caffeine and carbonated drinks. They can irritate the bladder.  · Drink cranberry juice or take cranberry pills. This may decrease the risk of getting a UTI.  · Do not drink alcohol.  · Keep all your appointments and tests as scheduled.   SEEK MEDICAL CARE IF:   · Your symptoms get worse.  · You are still having fevers 2 or more days after treatment begins.  · You have a rash.  · You feel that you are having problems with medicines prescribed.  · You have abnormal vaginal discharge.  SEEK IMMEDIATE MEDICAL CARE IF:   · You have back or flank   pain.  · You have chills.  · You have blood in your urine.  · You have nausea and vomiting.  · You have contractions of your uterus.  · You have a gush of fluid from the vagina.  MAKE SURE YOU:  · Understand these instructions.    · Will watch your condition.    · Will get help right away if you are not doing well or get worse.       This information is not intended to replace advice given to you by your health care provider. Make sure you discuss any questions you have with your health care provider.     Document Released: 02/13/2011 Document Revised: 08/09/2013 Document Reviewed: 05/18/2013  Elsevier Interactive Patient Education ©2016 Elsevier  Inc[employer]

## 2016-07-07 NOTE — MAU Note (Signed)
Pt presents complaining of lower abdominal pain that started at 0145 and has since resolved. States it felt like her last miscarriage. Denies bleeding or abnormal discharge.

## 2016-07-16 ENCOUNTER — Other Ambulatory Visit: Payer: Self-pay | Admitting: Obstetrics and Gynecology

## 2016-07-16 LAB — OB RESULTS CONSOLE GC/CHLAMYDIA
Chlamydia: NEGATIVE
GC PROBE AMP, GENITAL: NEGATIVE

## 2016-07-16 LAB — OB RESULTS CONSOLE ABO/RH: RH TYPE: POSITIVE

## 2016-07-16 LAB — OB RESULTS CONSOLE HIV ANTIBODY (ROUTINE TESTING): HIV: NONREACTIVE

## 2016-07-16 LAB — OB RESULTS CONSOLE RUBELLA ANTIBODY, IGM: Rubella: IMMUNE

## 2016-07-16 LAB — OB RESULTS CONSOLE RPR: RPR: NONREACTIVE

## 2016-07-16 LAB — OB RESULTS CONSOLE HEPATITIS B SURFACE ANTIGEN: HEP B S AG: NEGATIVE

## 2016-07-16 LAB — OB RESULTS CONSOLE ANTIBODY SCREEN: Antibody Screen: NEGATIVE

## 2016-11-02 NOTE — L&D Delivery Note (Signed)
Patient was C/C/+2 and pushed for 1 hour 20 minutes with epidural.   NSVD  female infant, Apgars 8,9, weight P.   The patient had a midline second degree perineal laceration repaired with 2-0 vicryl R. Fundus was firm. EBL was expected amount. Placenta was delivered intact. Vagina was clear.  Baby was handed to Neo after cord blood delay; baby was examined by them and was doing well without assistance. Baby  was vigorous and was then able to do skin to skin with mother.  Maria Morales A

## 2016-11-27 ENCOUNTER — Other Ambulatory Visit: Payer: Self-pay

## 2016-12-11 ENCOUNTER — Encounter (HOSPITAL_COMMUNITY): Payer: Self-pay

## 2016-12-11 ENCOUNTER — Inpatient Hospital Stay (HOSPITAL_COMMUNITY)
Admission: AD | Admit: 2016-12-11 | Discharge: 2016-12-11 | Disposition: A | Discharge status: Home / Self Care | Payer: BC Managed Care – PPO | Source: Ambulatory Visit | Attending: Obstetrics and Gynecology | Admitting: Obstetrics and Gynecology

## 2016-12-11 DIAGNOSIS — Z3A28 28 weeks gestation of pregnancy: Secondary | ICD-10-CM | POA: Diagnosis not present

## 2016-12-11 DIAGNOSIS — O4703 False labor before 37 completed weeks of gestation, third trimester: Secondary | ICD-10-CM | POA: Diagnosis not present

## 2016-12-11 DIAGNOSIS — Z3689 Encounter for other specified antenatal screening: Secondary | ICD-10-CM

## 2016-12-11 DIAGNOSIS — O368130 Decreased fetal movements, third trimester, not applicable or unspecified: Secondary | ICD-10-CM | POA: Insufficient documentation

## 2016-12-11 MED ORDER — NIFEDIPINE 10 MG PO CAPS
10.0000 mg | ORAL_CAPSULE | Freq: Once | ORAL | Status: AC
  Administered 2016-12-11: 10 mg via ORAL
  Filled 2016-12-11: qty 1

## 2016-12-11 NOTE — MAU Note (Signed)
Pt advised to come to MAU, FM has been decreased for the last 3 days.  Denies contractions, bleeding or LOF.

## 2016-12-11 NOTE — MAU Provider Note (Signed)
History     CSN: [id number]  Arrival date and time: 12/11/16 1617   First Provider Initiated Contact with Patient 12/11/16 1657      Chief Complaint  Patient presents with  . Decreased Fetal Movement   G2P0010 @28 .3 weeks here with decreased FM x2 days. She reports eating and drinking without problem. She's had 2 cups of water today. No recent illness. No VB, LOF, or cramping/ctx.     OB History    Gravida Para Term Preterm AB Living   2             SAB TAB Ectopic Multiple Live Births                  History reviewed. No pertinent past medical history.  History reviewed. No pertinent surgical history.  History reviewed. No pertinent family history.  Social History  Substance Use Topics  . Smoking status: Never Smoker  . Smokeless tobacco: Never Used  . Alcohol use No    Allergies: No Known Allergies  Prescriptions Prior to Admission  Medication Sig Dispense Refill Last Dose  . diphenhydrAMINE (BENADRYL) 12.5 MG/5ML liquid Take 12.5 mg by mouth 4 (four) times daily as needed for allergies.   Past Month at Unknown time  . hydrocortisone cream 0.5 % Apply 1 application topically daily as needed for itching.   Past Week at Unknown time  . Prenatal Vit-Fe Fumarate-FA (PRENATAL MULTIVITAMIN) TABS tablet Take 1 tablet by mouth daily at 12 noon.   12/11/2016 at Unknown time  . nitrofurantoin, macrocrystal-monohydrate, (MACROBID) 100 MG capsule Take 1 capsule (100 mg total) by mouth 2 (two) times daily. (Patient not taking: Reported on 12/11/2016) 10 capsule 0 Not Taking at Unknown time  . promethazine (PHENERGAN) 25 MG tablet Take 0.5-1 tablets (12.5-25 mg total) by mouth every 6 (six) hours as needed. (Patient not taking: Reported on 12/11/2016) 30 tablet 0 Not Taking at Unknown time    Review of Systems  Constitutional: Negative for fever.  Gastrointestinal: Negative for abdominal pain.  Genitourinary: Negative for pelvic pain and vaginal bleeding.   Physical Exam    Blood pressure 105/57, pulse 95, temperature 98.6 F (37 C), temperature source Oral, resp. rate 18, last menstrual period 05/26/2016, SpO2 99 %, unknown if currently breastfeeding.  Physical Exam  Nursing note and vitals reviewed. Constitutional: She is oriented to person, place, and time. She appears well-developed and well-nourished. No distress.  HENT:  Head: Normocephalic and atraumatic.  Neck: Normal range of motion.  Cardiovascular: Normal rate.   Respiratory: Effort normal.  GI: Soft. She exhibits no distension. There is no tenderness.  gravid  Genitourinary:  Genitourinary Comments: SVE: closed/thick  Musculoskeletal: Normal range of motion.  Neurological: She is alert and oriented to person, place, and time.  Skin: Skin is warm and dry.  Psychiatric: She has a normal mood and affect.   EFM: 135 bpm, mod variability, + accels, no decels Toco: irritability  MAU Course  Procedures Po hydration  MDM Uterine irritability initially then ctx q3-4, pt not feeling. Cervix closed/thick. Procardia ordered-will give SL, cannot swallow pills.  Presentation, clinical findings, and plan discussed with Dr. Henderson CloudHorvath. Ok for discharge if responds well to Procardia. Ctx ceased just after Procardia dosing. Stable for discharge home.  Assessment and Plan   1. [redacted] weeks gestation of pregnancy   2. NST (non-stress test) reactive   3. Decreased fetal movements in third trimester, single or unspecified fetus   4. Preterm uterine contractions, antepartum,  third trimester    Discharge home PTL precautions FMCs Follow up in office next week as scheduled Hydrate with water  Allergies as of 12/11/2016   No Known Allergies     Medication List    STOP taking these medications   nitrofurantoin (macrocrystal-monohydrate) 100 MG capsule Commonly known as:  MACROBID   promethazine 25 MG tablet Commonly known as:  PHENERGAN     TAKE these medications   diphenhydrAMINE 12.5 MG/5ML  liquid Commonly known as:  BENADRYL Take 12.5 mg by mouth 4 (four) times daily as needed for allergies.   hydrocortisone cream 0.5 % Apply 1 application topically daily as needed for itching.   prenatal multivitamin Tabs tablet Take 1 tablet by mouth daily at 12 noon.      Donette Larry, CNM 12/11/2016, 5:09 PM

## 2016-12-11 NOTE — Discharge Instructions (Signed)
Braxton Hicks Contractions Contractions of the uterus can occur throughout pregnancy. Contractions are not always a sign that you are in labor.  WHAT ARE BRAXTON HICKS CONTRACTIONS?  Contractions that occur before labor are called Braxton Hicks contractions, or false labor. Toward the end of pregnancy (32-34 weeks), these contractions can develop more often and may become more forceful. This is not true labor because these contractions do not result in opening (dilatation) and thinning of the cervix. They are sometimes difficult to tell apart from true labor because these contractions can be forceful and people have different pain tolerances. You should not feel embarrassed if you go to the hospital with false labor. Sometimes, the only way to tell if you are in true labor is for your health care provider to look for changes in the cervix. If there are no prenatal problems or other health problems associated with the pregnancy, it is completely safe to be sent home with false labor and await the onset of true labor. HOW CAN YOU TELL THE DIFFERENCE BETWEEN TRUE AND FALSE LABOR? False Labor   The contractions of false labor are usually shorter and not as hard as those of true labor.   The contractions are usually irregular.   The contractions are often felt in the front of the lower abdomen and in the groin.   The contractions may go away when you walk around or change positions while lying down.   The contractions get weaker and are shorter lasting as time goes on.   The contractions do not usually become progressively stronger, regular, and closer together as with true labor.  True Labor   Contractions in true labor last 30-70 seconds, become very regular, usually become more intense, and increase in frequency.   The contractions do not go away with walking.   The discomfort is usually felt in the top of the uterus and spreads to the lower abdomen and low back.   True labor can be  determined by your health care provider with an exam. This will show that the cervix is dilating and getting thinner.  WHAT TO REMEMBER  Keep up with your usual exercises and follow other instructions given by your health care provider.   Take medicines as directed by your health care provider.   Keep your regular prenatal appointments.   Eat and drink lightly if you think you are going into labor.   If Braxton Hicks contractions are making you uncomfortable:   Change your position from lying down or resting to walking, or from walking to resting.   Sit and rest in a tub of warm water.   Drink 2-3 glasses of water. Dehydration may cause these contractions.   Do slow and deep breathing several times an hour.  WHEN SHOULD I SEEK IMMEDIATE MEDICAL CARE? Seek immediate medical care if:  Your contractions become stronger, more regular, and closer together.   You have fluid leaking or gushing from your vagina.   You have a fever.   You pass blood-tinged mucus.   You have vaginal bleeding.   You have continuous abdominal pain.   You have low back pain that you never had before.   You feel your baby's head pushing down and causing pelvic pressure.   Your baby is not moving as much as it used to.  This information is not intended to replace advice given to you by your health care provider. Make sure you discuss any questions you have with your health care   provider. Document Released: 10/19/2005 Document Revised: 02/10/2016 Document Reviewed: 07/31/2013 Elsevier Interactive Patient Education  2017 Else[employer] Inc. Introduction Patient Name: ________________________________________________ Patient Due Date: ____________________ What is a fetal movement count? A fetal movement count is the number of times that you feel your baby move during a certain amount of time. This may also be called a fetal kick count. A fetal movement count is recommended for every pregnant  woman. You may be asked to start counting fetal movements as early as week 28 of your pregnancy. Pay attention to when your baby is most active. You may notice your baby's sleep and wake cycles. You may also notice things that make your baby move more. You should do a fetal movement count:  When your baby is normally most active.  At the same time each day. A good time to count movements is while you are resting, after having something to eat and drink. How do I count fetal movements? 1. Find a quiet, comfortable area. Sit, or lie down on your side. 2. Write down the date, the start time and stop time, and the number of movements that you felt between those two times. Take this information with you to your health care visits. 3. For 2 hours, count kicks, flutters, swishes, rolls, and jabs. You should feel at least 10 movements during 2 hours. 4. You may stop counting after you have felt 10 movements. 5. If you do not feel 10 movements in 2 hours, have something to eat and drink. Then, keep resting and counting for 1 hour. If you feel at least 4 movements during that hour, you may stop counting. Contact a health care provider if:  You feel fewer than 4 movements in 2 hours.  Your baby is not moving like he or she usually does. Date: ____________ Start time: ____________ Stop time: ____________ Movements: ____________ Date: ____________ Start time: ____________ Stop time: ____________ Movements: ____________ Date: ____________ Start time: ____________ Stop time: ____________ Movements: ____________ Date: ____________ Start time: ____________ Stop time: ____________ Movements: ____________ Date: ____________ Start time: ____________ Stop time: ____________ Movements: ____________ Date: ____________ Start time: ____________ Stop time: ____________ Movements: ____________ Date: ____________ Start time: ____________ Stop time: ____________ Movements: ____________ Date: ____________ Start time:  ____________ Stop time: ____________ Movements: ____________ Date: ____________ Start time: ____________ Stop time: ____________ Movements: ____________ This information is not intended to replace advice given to you by your health care provider. Make sure you discuss any questions you have with your health care provider. Document Released: 11/18/2006 Document Revised: 06/17/2016 Document Reviewed: 11/28/2015 Elsevier Interactive Patient Education  2017 Elsevier Inc.  

## 2017-01-20 ENCOUNTER — Encounter (HOSPITAL_COMMUNITY): Payer: Self-pay

## 2017-01-21 ENCOUNTER — Other Ambulatory Visit (HOSPITAL_COMMUNITY): Payer: Self-pay | Admitting: Obstetrics and Gynecology

## 2017-01-21 DIAGNOSIS — Z3A34 34 weeks gestation of pregnancy: Secondary | ICD-10-CM

## 2017-01-21 DIAGNOSIS — O35CXX0 Maternal care for other (suspected) fetal abnormality and damage, fetal pulmonary anomalies, not applicable or unspecified: Secondary | ICD-10-CM

## 2017-01-21 DIAGNOSIS — O358XX0 Maternal care for other (suspected) fetal abnormality and damage, not applicable or unspecified: Secondary | ICD-10-CM

## 2017-01-21 DIAGNOSIS — O283 Abnormal ultrasonic finding on antenatal screening of mother: Secondary | ICD-10-CM

## 2017-01-21 DIAGNOSIS — Z3689 Encounter for other specified antenatal screening: Secondary | ICD-10-CM

## 2017-01-22 ENCOUNTER — Other Ambulatory Visit (HOSPITAL_COMMUNITY): Payer: Self-pay | Admitting: Obstetrics and Gynecology

## 2017-01-22 ENCOUNTER — Ambulatory Visit (HOSPITAL_COMMUNITY)
Admission: RE | Admit: 2017-01-22 | Discharge: 2017-01-22 | Disposition: A | Discharge status: Home / Self Care | Payer: BC Managed Care – PPO | Source: Ambulatory Visit | Attending: Obstetrics and Gynecology | Admitting: Obstetrics and Gynecology

## 2017-01-22 ENCOUNTER — Other Ambulatory Visit (HOSPITAL_COMMUNITY): Payer: Self-pay | Admitting: *Deleted

## 2017-01-22 ENCOUNTER — Encounter (HOSPITAL_COMMUNITY): Payer: Self-pay

## 2017-01-22 DIAGNOSIS — O283 Abnormal ultrasonic finding on antenatal screening of mother: Secondary | ICD-10-CM | POA: Diagnosis not present

## 2017-01-22 DIAGNOSIS — O403XX0 Polyhydramnios, third trimester, not applicable or unspecified: Secondary | ICD-10-CM | POA: Diagnosis not present

## 2017-01-22 DIAGNOSIS — O409XX0 Polyhydramnios, unspecified trimester, not applicable or unspecified: Secondary | ICD-10-CM

## 2017-01-22 DIAGNOSIS — Z3689 Encounter for other specified antenatal screening: Secondary | ICD-10-CM | POA: Diagnosis present

## 2017-01-22 DIAGNOSIS — O359XX0 Maternal care for (suspected) fetal abnormality and damage, unspecified, not applicable or unspecified: Secondary | ICD-10-CM

## 2017-01-22 DIAGNOSIS — Z3A34 34 weeks gestation of pregnancy: Secondary | ICD-10-CM

## 2017-01-22 DIAGNOSIS — O35CXX0 Maternal care for other (suspected) fetal abnormality and damage, fetal pulmonary anomalies, not applicable or unspecified: Secondary | ICD-10-CM

## 2017-01-22 DIAGNOSIS — O358XX0 Maternal care for other (suspected) fetal abnormality and damage, not applicable or unspecified: Secondary | ICD-10-CM | POA: Insufficient documentation

## 2017-01-22 HISTORY — DX: Other specified health status: Z78.9

## 2017-01-22 MED ORDER — BETAMETHASONE SOD PHOS & ACET 6 (3-3) MG/ML IJ SUSP: 12.0000 mg | Freq: Once | INTRAMUSCULAR | Status: DC

## 2017-01-22 MED ORDER — BETAMETHASONE SOD PHOS & ACET 6 (3-3) MG/ML IJ SUSP
12.0000 mg | Freq: Once | INTRAMUSCULAR | Status: DC
  Filled 2017-01-22: qty 2

## 2017-01-22 NOTE — ED Notes (Signed)
Pt received first BMZ IM injection on RUQ today in [facility].  Pt instructed to go to MAU on 01-23-17 at noon for second dose.  Pt tolerated well.

## 2017-01-23 ENCOUNTER — Inpatient Hospital Stay (HOSPITAL_COMMUNITY)
Admission: AD | Admit: 2017-01-23 | Discharge: 2017-01-23 | Disposition: A | Discharge status: Home / Self Care | Payer: BC Managed Care – PPO | Source: Ambulatory Visit | Attending: Obstetrics and Gynecology | Admitting: Obstetrics and Gynecology

## 2017-01-23 DIAGNOSIS — Z3A00 Weeks of gestation of pregnancy not specified: Secondary | ICD-10-CM | POA: Insufficient documentation

## 2017-01-23 MED ORDER — BETAMETHASONE SOD PHOS & ACET 6 (3-3) MG/ML IJ SUSP
12.0000 mg | Freq: Once | INTRAMUSCULAR | Status: AC
  Administered 2017-01-23: 12 mg via INTRAMUSCULAR
  Filled 2017-01-23: qty 2

## 2017-01-25 ENCOUNTER — Ambulatory Visit (HOSPITAL_COMMUNITY): Admission: RE | Admit: 2017-01-25 | Payer: BC Managed Care – PPO | Source: Ambulatory Visit

## 2017-01-25 ENCOUNTER — Other Ambulatory Visit (HOSPITAL_COMMUNITY): Payer: Self-pay | Admitting: Maternal and Fetal Medicine

## 2017-01-25 ENCOUNTER — Ambulatory Visit (HOSPITAL_COMMUNITY)
Admission: RE | Admit: 2017-01-25 | Discharge: 2017-01-25 | Disposition: A | Discharge status: Home / Self Care | Payer: BC Managed Care – PPO | Source: Ambulatory Visit | Attending: Obstetrics and Gynecology | Admitting: Obstetrics and Gynecology

## 2017-01-25 ENCOUNTER — Encounter (HOSPITAL_COMMUNITY): Payer: Self-pay

## 2017-01-25 VITALS — BP 104/67 | HR 92 | Wt 176.2 lb

## 2017-01-25 DIAGNOSIS — O359XX0 Maternal care for (suspected) fetal abnormality and damage, unspecified, not applicable or unspecified: Secondary | ICD-10-CM

## 2017-01-25 DIAGNOSIS — O409XX0 Polyhydramnios, unspecified trimester, not applicable or unspecified: Secondary | ICD-10-CM

## 2017-01-25 DIAGNOSIS — O403XX0 Polyhydramnios, third trimester, not applicable or unspecified: Secondary | ICD-10-CM | POA: Diagnosis not present

## 2017-01-25 DIAGNOSIS — O358XX0 Maternal care for other (suspected) fetal abnormality and damage, not applicable or unspecified: Secondary | ICD-10-CM | POA: Insufficient documentation

## 2017-01-25 DIAGNOSIS — Z3A34 34 weeks gestation of pregnancy: Secondary | ICD-10-CM

## 2017-01-25 DIAGNOSIS — Z3A35 35 weeks gestation of pregnancy: Secondary | ICD-10-CM

## 2017-01-25 NOTE — ED Notes (Signed)
Dr. Ezzard StandingNewman in to discuss procedure with pt.

## 2017-01-25 NOTE — Procedures (Signed)
Maria Sawyersndrea N Robbins 11/03/1990 [id number]  Fetus A Non-Stress Test Interpretation for 01/25/17  Indication: preprocedure  Fetal Heart Rate A Mode: External Baseline Rate (A): 145 bpm Variability: Moderate Accelerations: 15 x 15 Decelerations: None Multiple birth?: No  Uterine Activity Mode: Toco Contraction Frequency (min): 2-4 Contraction Duration (sec): 60-200 Contraction Quality: Mild Resting Tone Palpated: Relaxed Resting Time: Adequate  Interpretation (Fetal Testing) Nonstress Test Interpretation: Reactive Comments: FHR tracing rev'd by Dr. Ezzard StandingNewman

## 2017-01-26 ENCOUNTER — Other Ambulatory Visit (HOSPITAL_COMMUNITY): Payer: Self-pay | Admitting: *Deleted

## 2017-01-26 ENCOUNTER — Other Ambulatory Visit: Payer: Self-pay | Admitting: Obstetrics and Gynecology

## 2017-01-26 DIAGNOSIS — O359XX0 Maternal care for (suspected) fetal abnormality and damage, unspecified, not applicable or unspecified: Secondary | ICD-10-CM

## 2017-01-26 LAB — OB RESULTS CONSOLE GBS: GBS: NEGATIVE

## 2017-01-27 ENCOUNTER — Other Ambulatory Visit (HOSPITAL_COMMUNITY): Payer: Self-pay | Admitting: *Deleted

## 2017-01-27 DIAGNOSIS — O359XX0 Maternal care for (suspected) fetal abnormality and damage, unspecified, not applicable or unspecified: Secondary | ICD-10-CM

## 2017-01-28 ENCOUNTER — Encounter (HOSPITAL_COMMUNITY): Payer: Self-pay

## 2017-01-28 ENCOUNTER — Ambulatory Visit (HOSPITAL_COMMUNITY): Admission: RE | Admit: 2017-01-28 | Payer: BC Managed Care – PPO | Source: Ambulatory Visit

## 2017-01-28 ENCOUNTER — Ambulatory Visit (HOSPITAL_COMMUNITY)
Admission: RE | Admit: 2017-01-28 | Discharge: 2017-01-28 | Disposition: A | Discharge status: Home / Self Care | Payer: BC Managed Care – PPO | Source: Ambulatory Visit | Attending: Obstetrics and Gynecology | Admitting: Obstetrics and Gynecology

## 2017-01-28 DIAGNOSIS — O403XX0 Polyhydramnios, third trimester, not applicable or unspecified: Secondary | ICD-10-CM | POA: Diagnosis not present

## 2017-01-28 DIAGNOSIS — O359XX0 Maternal care for (suspected) fetal abnormality and damage, unspecified, not applicable or unspecified: Secondary | ICD-10-CM

## 2017-01-28 DIAGNOSIS — O358XX0 Maternal care for other (suspected) fetal abnormality and damage, not applicable or unspecified: Secondary | ICD-10-CM | POA: Diagnosis present

## 2017-01-28 DIAGNOSIS — Z3A35 35 weeks gestation of pregnancy: Secondary | ICD-10-CM | POA: Insufficient documentation

## 2017-01-28 MED FILL — Betamethasone Sod Phosphate & Acetate Inj Susp 6 (3-3) MG/ML: INTRAMUSCULAR | Qty: 2 | Status: AC

## 2017-01-28 NOTE — Procedures (Signed)
Maria Sawyersndrea N Robbins 08/02/1991 1461w2d  Fetus A Non-Stress Test Interpretation for 01/28/17  Indication: fetal abnormality  Fetal Heart Rate A Mode: External Baseline Rate (A): 130 bpm Variability: Moderate Accelerations: 15 x 15 Decelerations: None Multiple birth?: No  Uterine Activity Mode: Toco Contraction Frequency (min): Occ UC noted Contraction Duration (sec): 180-300 Contraction Quality: Mild Resting Tone Palpated: Relaxed Resting Time: Adequate  Interpretation (Fetal Testing) Nonstress Test Interpretation: Reactive Comments: FHR tracing rev'd by Dr. Ezzard StandingNewman

## 2017-02-01 ENCOUNTER — Ambulatory Visit (HOSPITAL_COMMUNITY)
Admission: RE | Admit: 2017-02-01 | Discharge: 2017-02-01 | Disposition: A | Discharge status: Home / Self Care | Payer: BC Managed Care – PPO | Source: Ambulatory Visit | Attending: Obstetrics and Gynecology | Admitting: Obstetrics and Gynecology

## 2017-02-01 ENCOUNTER — Other Ambulatory Visit (HOSPITAL_COMMUNITY): Payer: Self-pay | Admitting: Obstetrics and Gynecology

## 2017-02-01 ENCOUNTER — Encounter (HOSPITAL_COMMUNITY): Payer: Self-pay

## 2017-02-01 DIAGNOSIS — O403XX0 Polyhydramnios, third trimester, not applicable or unspecified: Secondary | ICD-10-CM | POA: Diagnosis not present

## 2017-02-01 DIAGNOSIS — O359XX0 Maternal care for (suspected) fetal abnormality and damage, unspecified, not applicable or unspecified: Secondary | ICD-10-CM

## 2017-02-01 DIAGNOSIS — Z3A35 35 weeks gestation of pregnancy: Secondary | ICD-10-CM | POA: Diagnosis not present

## 2017-02-04 ENCOUNTER — Encounter (HOSPITAL_COMMUNITY): Payer: Self-pay

## 2017-02-04 ENCOUNTER — Ambulatory Visit (HOSPITAL_COMMUNITY)
Admission: RE | Admit: 2017-02-04 | Discharge: 2017-02-04 | Disposition: A | Discharge status: Home / Self Care | Payer: BC Managed Care – PPO | Source: Ambulatory Visit | Attending: Obstetrics and Gynecology | Admitting: Obstetrics and Gynecology

## 2017-02-04 DIAGNOSIS — O359XX0 Maternal care for (suspected) fetal abnormality and damage, unspecified, not applicable or unspecified: Secondary | ICD-10-CM | POA: Diagnosis not present

## 2017-02-04 DIAGNOSIS — Z3A36 36 weeks gestation of pregnancy: Secondary | ICD-10-CM | POA: Insufficient documentation

## 2017-02-08 ENCOUNTER — Encounter (HOSPITAL_COMMUNITY): Payer: Self-pay

## 2017-02-08 ENCOUNTER — Ambulatory Visit (HOSPITAL_COMMUNITY)
Admission: RE | Admit: 2017-02-08 | Discharge: 2017-02-08 | Disposition: A | Discharge status: Home / Self Care | Payer: BC Managed Care – PPO | Source: Ambulatory Visit | Attending: Obstetrics and Gynecology | Admitting: Obstetrics and Gynecology

## 2017-02-08 DIAGNOSIS — O359XX0 Maternal care for (suspected) fetal abnormality and damage, unspecified, not applicable or unspecified: Secondary | ICD-10-CM | POA: Insufficient documentation

## 2017-02-08 DIAGNOSIS — Z3A36 36 weeks gestation of pregnancy: Secondary | ICD-10-CM | POA: Insufficient documentation

## 2017-02-09 ENCOUNTER — Other Ambulatory Visit (HOSPITAL_COMMUNITY): Payer: Self-pay | Admitting: *Deleted

## 2017-02-09 DIAGNOSIS — IMO0002 Reserved for concepts with insufficient information to code with codable children: Secondary | ICD-10-CM

## 2017-02-10 ENCOUNTER — Other Ambulatory Visit: Payer: Self-pay | Admitting: Obstetrics and Gynecology

## 2017-02-11 ENCOUNTER — Encounter (HOSPITAL_COMMUNITY): Payer: Self-pay

## 2017-02-11 ENCOUNTER — Ambulatory Visit (HOSPITAL_COMMUNITY)
Admission: RE | Admit: 2017-02-11 | Discharge: 2017-02-11 | Disposition: A | Discharge status: Home / Self Care | Payer: BC Managed Care – PPO | Source: Ambulatory Visit | Attending: Obstetrics and Gynecology | Admitting: Obstetrics and Gynecology

## 2017-02-11 ENCOUNTER — Telehealth (INDEPENDENT_AMBULATORY_CARE_PROVIDER_SITE_OTHER): Payer: Self-pay

## 2017-02-11 ENCOUNTER — Other Ambulatory Visit (HOSPITAL_COMMUNITY): Payer: Self-pay | Admitting: Obstetrics and Gynecology

## 2017-02-11 DIAGNOSIS — O359XX0 Maternal care for (suspected) fetal abnormality and damage, unspecified, not applicable or unspecified: Secondary | ICD-10-CM

## 2017-02-11 DIAGNOSIS — Z3A37 37 weeks gestation of pregnancy: Secondary | ICD-10-CM | POA: Insufficient documentation

## 2017-02-11 NOTE — ED Notes (Signed)
Dr. Katrinka Blazing in NICU called to come do consult with pt/FOB during NST today.  MD on way.

## 2017-02-11 NOTE — Telephone Encounter (Signed)
  Who's calling (name and relationship to patient) :[facility]  Best contact [phone]  Provider they see:Adibe  Reason for call:[facility] see if procedure scheduled with you can be changed to April 13th or 16th. Mom is going to be indused at 38 weeks next Tues. The 17th.      PRESCRIPTION REFILL ONLY  Name of prescription:  Pharmacy:

## 2017-02-11 NOTE — Procedures (Signed)
Maria Morales [birth year] [redacted]w[redacted]d  Fetus A Non-Stress Test Interpretation for 02/11/17  Indication: Unsatisfactory BPP  Fetal Heart Rate A Mode: External Baseline Rate (A): 120 bpm Variability: Moderate Accelerations: 15 x 15 Decelerations: None Multiple birth?: No  Uterine Activity Mode: Toco, Palpation Contraction Frequency (min): 2-3 Contraction Duration (sec): 50-60 Contraction Quality: Mild Resting Tone Palpated: Relaxed Resting Time: Adequate  Interpretation (Fetal Testing) Nonstress Test Interpretation: Reactive Overall Impression: Reassuring for gestational age Comments: FHR tracing reviewed with Dr. Claudean Severance

## 2017-02-12 ENCOUNTER — Telehealth (HOSPITAL_COMMUNITY): Payer: Self-pay | Admitting: *Deleted

## 2017-02-12 NOTE — Telephone Encounter (Signed)
LM for pt to return call to MFM regarding rescheduling Pediatric Surgeon Consult appointment.

## 2017-02-12 NOTE — Telephone Encounter (Signed)
Mayah and Dr. Gus Puma are aware of the situation.

## 2017-02-12 NOTE — Consult Note (Signed)
[place] 02/12/2017    10:22 AM  Neonatal Medicine Consultation         Maria Morales          MRN:  [id number]  I was called at the request of MFM (Dr. 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    The pregnancy has been complicated by preterm labor for which she received a course of betamethasone on 3/24 and 3/23.  Her OB care has been with Drs. Anderson's and Dr. Malachi Carl group.  She had an ultrasound on 3/29 that picked up a fetal mass in the left thorax consistent with sequestration.  Polyhydramnios but no sign of hydrops was noted.  Repeat ultrasounds on 4/2, 4/5, and 4/12 show the mass is in the lower left lung, measuring about 3.9 x 3.8 x 2.87 cm.  Feeder vessels have been noted.  A thin rim of pleural effusion was improved on the last study.  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   The parents are unaware of any family history of a similar pulmonary problem.  I met with the patient and her spouse.  I reviewed the suspected diagnosis as based on the ultrasound findings, and described the usual types.  I then reviewed what neonatology would do when the baby is born, which is basically to attend the delivery, assess the baby's presence or absence of respiratory distress, which if persistent would lead to admitting him to the NICU.  If he looks well following birth, free of any respiratory difficulty, I would expect he could stay with his parents.  Regardless of presence or absence of symptoms, he needs a CXR not long after birth which would be used to determine subsequent work-up.  If the CXR looks unremarkable, a slower pace of evaluation can be taken but ultimately a CT or MRI of the chest should be done to assist the pediatric surgeon in deciding if and when to surgically remove the lesion.  Dr. Windy Canny should be consulted to work out the timing and components of further  evaluation.  If the lesion appears large on CXR, or other abnormalities are noted, CT or MRI will need to be done relatively soon (again consulting with Dr. Windy Canny regarding the timing and his preferred testing).    Sequestrations in general do not communicate with the tracheobronchial tree, however there are occasional exceptions that could increase the risk of pneumonia.  There can also be communications with the esophagus or stomach, giving another route for infection to occur.  There are also occasional coexisting diseases such as congenital heart disease or vertebral abnormalities, congenital diaphragmatic hernia, and colonic duplication--these are more often seen in extralobar sequestrations, but can be found with intralobar disease.     This patient and her spouse should meet with our pediatric surgeon, Dr. Windy Canny, prior to delivery.  I have given them a general description of how the baby might be handled following birth, but much depends on the presence of symptoms, and ultimately how this condition is treated depends on the surgeon's evaluation.  MFM is arranging an appointment for the parents.  I spent 60 minutes reviewing the record, speaking to the patient, and entering appropriate documentation.  More than 50% of the time was spent face to face with patient.   _____________________ Electronically Signed By: Roosevelt Locks, MD Neonatologist

## 2017-02-15 ENCOUNTER — Encounter (HOSPITAL_COMMUNITY): Payer: Self-pay

## 2017-02-15 ENCOUNTER — Ambulatory Visit (INDEPENDENT_AMBULATORY_CARE_PROVIDER_SITE_OTHER): Payer: BC Managed Care – PPO | Admitting: Surgery

## 2017-02-15 ENCOUNTER — Ambulatory Visit (HOSPITAL_COMMUNITY)
Admission: RE | Admit: 2017-02-15 | Discharge: 2017-02-15 | Disposition: A | Discharge status: Home / Self Care | Payer: BC Managed Care – PPO | Source: Ambulatory Visit | Attending: Obstetrics and Gynecology | Admitting: Obstetrics and Gynecology

## 2017-02-15 DIAGNOSIS — O359XX0 Maternal care for (suspected) fetal abnormality and damage, unspecified, not applicable or unspecified: Secondary | ICD-10-CM

## 2017-02-15 DIAGNOSIS — Z3A37 37 weeks gestation of pregnancy: Secondary | ICD-10-CM

## 2017-02-15 DIAGNOSIS — Z9289 Personal history of other medical treatment: Secondary | ICD-10-CM

## 2017-02-15 DIAGNOSIS — O358XX0 Maternal care for other (suspected) fetal abnormality and damage, not applicable or unspecified: Secondary | ICD-10-CM | POA: Insufficient documentation

## 2017-02-15 NOTE — Progress Notes (Signed)
[facility] Pediatric General Surgery    Thank you for th referral of Maria Morales.  As you know, she is a 26 y.o. G2P0010 woman who is carrying a fetus at approximately [redacted] weeks gestation with a cystic lung lesion. Sonogram images shows what is presumed to be a pulmonary sequestration, a type of congenital pulmonary airway malformation.  I appreciate the chance to see her in prenatal consultation, and we reviewed pertinent surgical issues in reference to prenatal, perinatal, and postnatal issues regarding this fetus.  As you know, Ms. Maria Morales is in otherwise good health. There is no family history of any congenital anomaly.  She has had an uncomplicated pregnancy to date and remains on prenatal vitamins.  Her social history is as follows:   reports that she has never smoked. She has never used smokeless tobacco. She reports that she does not drink alcohol or use drugs..  She underwent a fetal ultrasound at 35 weeks demonstrating a left lung lesion measuring 3.3 x 2.9 x 4.1 cm. CVR is measured at 0.6 (low risk for hydrops).  In terms of prenatal issues, I confirmed with her today that this most likely represents a pulmonary malformaton, but that it certainly could represent another type of congenital airway malformation. Other than to obtain regular ultrasounds with your guidance, I will leave all further perinatal concerns to your expertise.  In terms of perinatal issues, I understand Maria Morales at [facility] via schedule c-section on April 17th.  I will defer all issues regarding perinatal management to your expertise.  Finally, in terms of postnatal issues, I will be able to assist with evaluation and care of this baby in consultation with the neonatology staff after birth.  She is aware that in the event of pulmonary distress, which is unlikely, early imaging and intervention may be required.  However, given the size of the lesion, most likely the  child will be asymptomatic or mildly symptomatic and imaging with a CT scan and potential resection can be deferred for 4-6 months after birth.  We have discussed options of an open thoracotomy and thoracoscopic approaches.  Resection would most likely require lobectomy, although CT imaging and operative findings will determine extent of resection. We discussed that resection will be performed at a children's hospital with pediatric anesthesia available.  I think Ms. Maria Morales found this session quite helpful.  It has been a pleasure performing this consultation. Please do not hesitate to call our office with questions or concerns.  I spent approximately 80 minutes in face-to-face consultation and chart review.  Kandice Hams, MD

## 2017-02-15 NOTE — Procedures (Signed)
Maria Morales [birth year] [redacted]w[redacted]d  Fetus A Non-Stress Test Interpretation for 02/15/17  Indication: Unsatisfactory BPP  Fetal Heart Rate A Mode: External Baseline Rate (A): 120 bpm Variability: Moderate Accelerations: 15 x 15 Decelerations: None Multiple birth?: No  Uterine Activity Mode: Toco, Palpation Contraction Frequency (min): Irregular Contraction Duration (sec): 40-50 Contraction Quality: Mild Resting Tone Palpated: Relaxed Resting Time: Adequate  Interpretation (Fetal Testing) Nonstress Test Interpretation: Reactive Overall Impression: Reassuring for gestational age Comments: FHR tracing reviewed with Dr. Ezzard Standing

## 2017-02-16 ENCOUNTER — Encounter (HOSPITAL_COMMUNITY): Payer: Self-pay

## 2017-02-16 ENCOUNTER — Inpatient Hospital Stay (HOSPITAL_COMMUNITY): Payer: BC Managed Care – PPO | Admitting: Anesthesiology

## 2017-02-16 ENCOUNTER — Inpatient Hospital Stay (HOSPITAL_COMMUNITY)
Admission: RE | Admit: 2017-02-16 | Discharge: 2017-02-19 | DRG: 775 | Disposition: A | Discharge status: Home / Self Care | Payer: BC Managed Care – PPO | Source: Ambulatory Visit | Attending: Obstetrics and Gynecology | Admitting: Obstetrics and Gynecology

## 2017-02-16 DIAGNOSIS — Z3A38 38 weeks gestation of pregnancy: Secondary | ICD-10-CM

## 2017-02-16 DIAGNOSIS — R339 Retention of urine, unspecified: Secondary | ICD-10-CM | POA: Diagnosis not present

## 2017-02-16 DIAGNOSIS — O358XX0 Maternal care for other (suspected) fetal abnormality and damage, not applicable or unspecified: Secondary | ICD-10-CM | POA: Diagnosis present

## 2017-02-16 DIAGNOSIS — Z3490 Encounter for supervision of normal pregnancy, unspecified, unspecified trimester: Secondary | ICD-10-CM

## 2017-02-16 DIAGNOSIS — O26893 Other specified pregnancy related conditions, third trimester: Secondary | ICD-10-CM | POA: Diagnosis present

## 2017-02-16 LAB — ABO/RH: ABO/RH(D): A POS

## 2017-02-16 LAB — CBC
HCT: 29.3 % — ABNORMAL LOW (ref 36.0–46.0)
Hemoglobin: 9.2 g/dL — ABNORMAL LOW (ref 12.0–15.0)
MCH: 24.9 pg — AB (ref 26.0–34.0)
MCHC: 31.4 g/dL (ref 30.0–36.0)
MCV: 79.4 fL (ref 78.0–100.0)
PLATELETS: 216 10*3/uL (ref 150–400)
RBC: 3.69 MIL/uL — AB (ref 3.87–5.11)
RDW: 16.1 % — ABNORMAL HIGH (ref 11.5–15.5)
WBC: 7.8 10*3/uL (ref 4.0–10.5)

## 2017-02-16 LAB — TYPE AND SCREEN
ABO/RH(D): A POS
ANTIBODY SCREEN: NEGATIVE

## 2017-02-16 LAB — RPR: RPR Ser Ql: NONREACTIVE

## 2017-02-16 MED ORDER — OXYTOCIN BOLUS FROM INFUSION
500.0000 mL | Freq: Once | INTRAVENOUS | Status: AC
  Administered 2017-02-17: 500 mL via INTRAVENOUS

## 2017-02-16 MED ORDER — LACTATED RINGERS IV SOLN
500.0000 mL | Freq: Once | INTRAVENOUS | Status: AC
  Administered 2017-02-16: 500 mL via INTRAVENOUS

## 2017-02-16 MED ORDER — LACTATED RINGERS IV SOLN
500.0000 mL | INTRAVENOUS | Status: DC | PRN
  Administered 2017-02-16: 500 mL via INTRAVENOUS

## 2017-02-16 MED ORDER — LIDOCAINE HCL (PF) 1 % IJ SOLN
30.0000 mL | INTRAMUSCULAR | Status: AC | PRN
  Administered 2017-02-17: 30 mL via SUBCUTANEOUS
  Filled 2017-02-16: qty 30

## 2017-02-16 MED ORDER — PHENYLEPHRINE 40 MCG/ML (10ML) SYRINGE FOR IV PUSH (FOR BLOOD PRESSURE SUPPORT)
80.0000 ug | PREFILLED_SYRINGE | INTRAVENOUS | Status: DC | PRN
  Filled 2017-02-16: qty 5

## 2017-02-16 MED ORDER — ONDANSETRON HCL 4 MG/2ML IJ SOLN
4.0000 mg | Freq: Four times a day (QID) | INTRAMUSCULAR | Status: DC | PRN
  Administered 2017-02-16: 4 mg via INTRAVENOUS
  Filled 2017-02-16: qty 2

## 2017-02-16 MED ORDER — LIDOCAINE HCL (PF) 1 % IJ SOLN
INTRAMUSCULAR | Status: DC | PRN
  Administered 2017-02-16 (×2): 5 mL via EPIDURAL

## 2017-02-16 MED ORDER — FENTANYL CITRATE (PF) 100 MCG/2ML IJ SOLN: 100.0000 ug | INTRAMUSCULAR | Status: DC | PRN

## 2017-02-16 MED ORDER — ACETAMINOPHEN 325 MG PO TABS: 650.0000 mg | ORAL_TABLET | ORAL | Status: DC | PRN

## 2017-02-16 MED ORDER — OXYCODONE-ACETAMINOPHEN 5-325 MG PO TABS: 2.0000 | ORAL_TABLET | ORAL | Status: DC | PRN

## 2017-02-16 MED ORDER — FLEET ENEMA 7-19 GM/118ML RE ENEM: 1.0000 | ENEMA | RECTAL | Status: DC | PRN

## 2017-02-16 MED ORDER — MISOPROSTOL 25 MCG QUARTER TABLET
25.0000 ug | ORAL_TABLET | ORAL | Status: DC | PRN
  Administered 2017-02-16 (×2): 25 ug via VAGINAL
  Filled 2017-02-16 (×3): qty 1

## 2017-02-16 MED ORDER — PHENYLEPHRINE 40 MCG/ML (10ML) SYRINGE FOR IV PUSH (FOR BLOOD PRESSURE SUPPORT)
80.0000 ug | PREFILLED_SYRINGE | INTRAVENOUS | Status: DC | PRN
  Filled 2017-02-16: qty 5
  Filled 2017-02-16: qty 10

## 2017-02-16 MED ORDER — DIPHENHYDRAMINE HCL 50 MG/ML IJ SOLN: 12.5000 mg | INTRAMUSCULAR | Status: DC | PRN

## 2017-02-16 MED ORDER — TERBUTALINE SULFATE 1 MG/ML IJ SOLN
0.2500 mg | Freq: Once | INTRAMUSCULAR | Status: DC | PRN
  Filled 2017-02-16: qty 1

## 2017-02-16 MED ORDER — OXYCODONE-ACETAMINOPHEN 5-325 MG PO TABS
1.0000 | ORAL_TABLET | ORAL | Status: DC | PRN
Start: 2017-02-16 — End: 2017-02-17

## 2017-02-16 MED ORDER — LACTATED RINGERS IV SOLN
INTRAVENOUS | Status: DC
  Administered 2017-02-16 (×3): via INTRAVENOUS

## 2017-02-16 MED ORDER — EPHEDRINE 5 MG/ML INJ
10.0000 mg | INTRAVENOUS | Status: DC | PRN
  Filled 2017-02-16: qty 2

## 2017-02-16 MED ORDER — SOD CITRATE-CITRIC ACID 500-334 MG/5ML PO SOLN: 30.0000 mL | ORAL | Status: DC | PRN

## 2017-02-16 MED ORDER — FENTANYL 2.5 MCG/ML BUPIVACAINE 1/10 % EPIDURAL INFUSION (WH - ANES)
14.0000 mL/h | INTRAMUSCULAR | Status: DC | PRN
  Administered 2017-02-16 (×2): 14 mL/h via EPIDURAL
  Filled 2017-02-16 (×2): qty 100

## 2017-02-16 MED ORDER — OXYTOCIN 40 UNITS IN LACTATED RINGERS INFUSION - SIMPLE MED
2.5000 [IU]/h | INTRAVENOUS | Status: DC
  Administered 2017-02-17: 2.5 [IU]/h via INTRAVENOUS
  Filled 2017-02-16: qty 1000

## 2017-02-16 MED ORDER — OXYTOCIN 40 UNITS IN LACTATED RINGERS INFUSION - SIMPLE MED
1.0000 m[IU]/min | INTRAVENOUS | Status: DC
  Administered 2017-02-16: 2 m[IU]/min via INTRAVENOUS

## 2017-02-16 NOTE — Progress Notes (Signed)
Pt admitted for induction- 26 y.o. G2P0010 [redacted]w[redacted]d with fetal chest mass that has been extensively evaluated and followed by MFM and Pediatric surgery. For induction at term per MFM.  Neonatologists aware and agree to delivery here at [facility].  Vitals:   02/16/17 0103 02/16/17 0457  BP:  121/65  Pulse:  75  Resp: 18 17  Temp: 98.9 F (37.2 C) 98.5 F (36.9 C)    FHTs 120s, good STV, NST R.  Occ early; cat 1 tracing. Toco q 3-4 min SVE 1/40/-3  A/P continue induction.  Neo to be present at delivery.  GBS neg.

## 2017-02-16 NOTE — Anesthesia Pain Management Evaluation Note (Signed)
  CRNA Pain Management Visit Note  Patient: Maria Morales, 26 y.o., female  "Hello I am a member of the anesthesia team at [facility]. We have an anesthesia team available at all times to provide care throughout the hospital, including epidural management and anesthesia for C-section. I don't know your plan for the delivery whether it a natural birth, water birth, IV sedation, nitrous supplementation, doula or epidural, but we want to meet your pain goals."   1.Was your pain managed to your expectations on prior hospitalizations?   No prior hospitalizations  2.What is your expectation for pain management during this hospitalization?     Epidural  3.How can we help you reach that goal? unsure  Record the patient's initial score and the patient's pain goal.   Pain: 1  Pain Goal: 4 The [facility] wants you to be able to say your pain was always managed very well.  Cephus Shelling 02/16/2017

## 2017-02-16 NOTE — H&P (Signed)
Pt is a 26 y/o white female, G2P0010 at 83 weeks who is admitted for induction at the suggestion of the MFM staff.  PNC was uncomplicated until 34 weeks. At that time she was c/o contractions.She had an u/s performed which revealed an increased AFI and a left plural effusion. She had the effusion drained and was then seen BIW by MFM. There was a mass seen in the chest and it is believed that the baby has pulmonary sequestration. The mother has met with a Agricultural consultant. NICU and MFM feel that it is safe for baby to be delivered at [facility]. The pleural effusion has not returned. There is a family h.o. Dwarfism.   PMHX: see hollister  PE: VSSAF        HEENT-wnl        ABD- gravid, non tender        FHTs- reactive  IMP/ Pulmonary Sequestration         FMHX of dwarfism Plan/ Start induction           Contact NICU- Done Talked to Dr. Clifton James

## 2017-02-16 NOTE — Anesthesia Procedure Notes (Signed)
Epidural Patient location during procedure: OB Start time: 02/16/2017 3:03 PM End time: 02/16/2017 3:17 PM  Staffing Anesthesiologist: Heather Roberts Performed: anesthesiologist   Preanesthetic Checklist Completed: patient identified, site marked, pre-op evaluation, timeout performed, IV checked, risks and benefits discussed and monitors and equipment checked  Epidural Patient position: sitting Prep: DuraPrep Patient monitoring: heart rate, cardiac monitor, continuous pulse ox and blood pressure Approach: midline Location: L2-L3 Injection technique: LOR saline  Needle:  Needle type: Tuohy  Needle gauge: 17 G Needle length: 9 cm Needle insertion depth: 6 cm Catheter size: 20 Guage Catheter at skin depth: 11 cm Test dose: negative and Other  Assessment Events: blood not aspirated, injection not painful, no injection resistance and negative IV test  Additional Notes Informed consent obtained prior to proceeding including risk of failure, 1% risk of PDPH, risk of minor discomfort and bruising.  Discussed rare but serious complications including epidural abscess, permanent nerve injury, epidural hematoma.  Discussed alternatives to epidural analgesia and patient desires to proceed.  Timeout performed pre-procedure verifying patient name, procedure, and platelet count.  Patient tolerated procedure well.

## 2017-02-16 NOTE — Anesthesia Preprocedure Evaluation (Signed)
Anesthesia Evaluation  Patient identified by MRN, date of birth, ID band Patient awake    Reviewed: Allergy & Precautions, NPO status , Patient's Chart, lab work & pertinent test results  Airway Mallampati: II  TM Distance: >3 FB Neck ROM: Full    Dental no notable dental hx. (+) Dental Advisory Given   Pulmonary neg pulmonary ROS,    Pulmonary exam normal        Cardiovascular negative cardio ROS Normal cardiovascular exam     Neuro/Psych negative neurological ROS  negative psych ROS   GI/Hepatic negative GI ROS, Neg liver ROS,   Endo/Other  negative endocrine ROS  Renal/GU negative Renal ROS  negative genitourinary   Musculoskeletal negative musculoskeletal ROS (+)   Abdominal   Peds negative pediatric ROS (+)  Hematology negative hematology ROS (+)   Anesthesia Other Findings   Reproductive/Obstetrics (+) Pregnancy                             Anesthesia Physical Anesthesia Plan  ASA: II  Anesthesia Plan: Epidural   Post-op Pain Management:    Induction:   Airway Management Planned: Natural Airway and Simple Face Mask  Additional Equipment:   Intra-op Plan:   Post-operative Plan:   Informed Consent: I have reviewed the patients History and Physical, chart, labs and discussed the procedure including the risks, benefits and alternatives for the proposed anesthesia with the patient or authorized representative who has indicated his/her understanding and acceptance.   Dental advisory given  Plan Discussed with: Anesthesiologist  Anesthesia Plan Comments:         Anesthesia Quick Evaluation

## 2017-02-17 ENCOUNTER — Encounter (HOSPITAL_COMMUNITY): Payer: Self-pay

## 2017-02-17 LAB — CBC
HEMATOCRIT: 27 % — AB (ref 36.0–46.0)
HEMOGLOBIN: 8.9 g/dL — AB (ref 12.0–15.0)
MCH: 25.9 pg — ABNORMAL LOW (ref 26.0–34.0)
MCHC: 33 g/dL (ref 30.0–36.0)
MCV: 78.7 fL (ref 78.0–100.0)
Platelets: 184 10*3/uL (ref 150–400)
RBC: 3.43 MIL/uL — ABNORMAL LOW (ref 3.87–5.11)
RDW: 16.2 % — ABNORMAL HIGH (ref 11.5–15.5)
WBC: 18.3 10*3/uL — AB (ref 4.0–10.5)

## 2017-02-17 MED ORDER — MEASLES, MUMPS & RUBELLA VAC ~~LOC~~ INJ
0.5000 mL | INJECTION | Freq: Once | SUBCUTANEOUS | Status: DC
  Filled 2017-02-17: qty 0.5

## 2017-02-17 MED ORDER — IBUPROFEN 800 MG PO TABS: 800.0000 mg | ORAL_TABLET | Freq: Three times a day (TID) | ORAL | Status: DC

## 2017-02-17 MED ORDER — METHYLERGONOVINE MALEATE 0.2 MG PO TABS: 0.2000 mg | ORAL_TABLET | ORAL | Status: DC | PRN

## 2017-02-17 MED ORDER — SODIUM CHLORIDE 0.9% FLUSH: 3.0000 mL | Freq: Two times a day (BID) | INTRAVENOUS | Status: DC

## 2017-02-17 MED ORDER — MAGNESIUM HYDROXIDE 400 MG/5ML PO SUSP: 30.0000 mL | ORAL | Status: DC | PRN

## 2017-02-17 MED ORDER — PRENATAL MULTIVITAMIN CH
1.0000 | ORAL_TABLET | Freq: Every day | ORAL | Status: DC
  Administered 2017-02-18: 1 via ORAL
  Filled 2017-02-17: qty 1

## 2017-02-17 MED ORDER — DIBUCAINE 1 % RE OINT
1.0000 "application " | TOPICAL_OINTMENT | RECTAL | Status: DC | PRN
  Administered 2017-02-17: 1 via RECTAL
  Filled 2017-02-17: qty 28

## 2017-02-17 MED ORDER — TETANUS-DIPHTH-ACELL PERTUSSIS 5-2.5-18.5 LF-MCG/0.5 IM SUSP: 0.5000 mL | Freq: Once | INTRAMUSCULAR | Status: DC

## 2017-02-17 MED ORDER — BENZOCAINE-MENTHOL 20-0.5 % EX AERO
1.0000 "application " | INHALATION_SPRAY | CUTANEOUS | Status: DC | PRN
  Administered 2017-02-17: 1 via TOPICAL
  Filled 2017-02-17: qty 56

## 2017-02-17 MED ORDER — IBUPROFEN 100 MG/5ML PO SUSP
800.0000 mg | Freq: Three times a day (TID) | ORAL | Status: DC
  Administered 2017-02-17 – 2017-02-19 (×7): 800 mg via ORAL
  Filled 2017-02-17 (×7): qty 40

## 2017-02-17 MED ORDER — ACETAMINOPHEN 325 MG PO TABS: 650.0000 mg | ORAL_TABLET | ORAL | Status: DC | PRN

## 2017-02-17 MED ORDER — COCONUT OIL OIL
1.0000 | TOPICAL_OIL | Status: DC | PRN
Start: 2017-02-17 — End: 2017-02-19

## 2017-02-17 MED ORDER — OXYCODONE-ACETAMINOPHEN 5-325 MG PO TABS: 1.0000 | ORAL_TABLET | ORAL | Status: DC | PRN

## 2017-02-17 MED ORDER — ONDANSETRON HCL 4 MG PO TABS
4.0000 mg | ORAL_TABLET | ORAL | Status: DC | PRN
Start: 2017-02-17 — End: 2017-02-19

## 2017-02-17 MED ORDER — SENNOSIDES-DOCUSATE SODIUM 8.6-50 MG PO TABS
2.0000 | ORAL_TABLET | ORAL | Status: DC
  Filled 2017-02-17: qty 2

## 2017-02-17 MED ORDER — METHYLERGONOVINE MALEATE 0.2 MG/ML IJ SOLN: 0.2000 mg | INTRAMUSCULAR | Status: DC | PRN

## 2017-02-17 MED ORDER — FERROUS SULFATE 300 (60 FE) MG/5ML PO SYRP
300.0000 mg | ORAL_SOLUTION | Freq: Two times a day (BID) | ORAL | Status: DC
  Administered 2017-02-17 – 2017-02-19 (×4): 300 mg via ORAL
  Filled 2017-02-17 (×4): qty 5

## 2017-02-17 MED ORDER — ONDANSETRON HCL 4 MG/2ML IJ SOLN: 4.0000 mg | INTRAMUSCULAR | Status: DC | PRN

## 2017-02-17 MED ORDER — OXYCODONE-ACETAMINOPHEN 5-325 MG PO TABS: 2.0000 | ORAL_TABLET | ORAL | Status: DC | PRN

## 2017-02-17 MED ORDER — DIPHENHYDRAMINE HCL 25 MG PO CAPS: 25.0000 mg | ORAL_CAPSULE | Freq: Four times a day (QID) | ORAL | Status: DC | PRN

## 2017-02-17 MED ORDER — SODIUM CHLORIDE 0.9 % IV SOLN: 250.0000 mL | INTRAVENOUS | Status: DC | PRN

## 2017-02-17 MED ORDER — FERROUS SULFATE 325 (65 FE) MG PO TABS
325.0000 mg | ORAL_TABLET | Freq: Two times a day (BID) | ORAL | Status: DC
  Filled 2017-02-17: qty 1

## 2017-02-17 MED ORDER — SIMETHICONE 80 MG PO CHEW: 80.0000 mg | CHEWABLE_TABLET | ORAL | Status: DC | PRN

## 2017-02-17 MED ORDER — SODIUM CHLORIDE 0.9% FLUSH: 3.0000 mL | INTRAVENOUS | Status: DC | PRN

## 2017-02-17 MED ORDER — FERROUS SULFATE 300 (60 FE) MG/5ML PO SYRP
300.0000 mg | ORAL_SOLUTION | Freq: Two times a day (BID) | ORAL | Status: DC
  Filled 2017-02-17 (×2): qty 5

## 2017-02-17 MED ORDER — WITCH HAZEL-GLYCERIN EX PADS
1.0000 "application " | MEDICATED_PAD | CUTANEOUS | Status: DC | PRN
  Administered 2017-02-17: 1 via TOPICAL

## 2017-02-17 MED ORDER — ZOLPIDEM TARTRATE 5 MG PO TABS: 5.0000 mg | ORAL_TABLET | Freq: Every evening | ORAL | Status: DC | PRN

## 2017-02-17 NOTE — Progress Notes (Signed)
Post Partum Day 0 Subjective: no complaints, voiding, tolerating PO and pumping. Mother and baby are bonding well.  Objective: Blood pressure 108/68, pulse (!) 101, temperature 98.1 F (36.7 C), temperature source Oral, resp. rate 18, height  (1.626 m), weight 77.6 kg (171 lb), last menstrual period 05/26/2016, SpO2 100 %, unknown if currently breastfeeding.  Physical Exam:  General: alert, cooperative and no distress Lochia: appropriate Uterine Fundus: firm Perineum: no significant drainage, no dehiscence, decrease  DVT Evaluation: No evidence of DVT seen on physical exam. Negative Homan's sign.   Recent Labs  02/16/17 0125 02/17/17 0543  HGB 9.2* 8.9*  HCT 29.3* 27.0*    Assessment/Plan: Breastfeeding  No issues Will continue routine post partum care Baby rooming in not in NICU, has CXR scheduled   LOS: 1 day   Maria Morales STACIA 02/17/2017, 8:43 AM

## 2017-02-17 NOTE — Lactation Note (Addendum)
This note was copied from a baby's chart. Lactation Consultation Note New mom was assisted in BF after delivery. Mom doesn't like BF. Has sensititive breast, didn't like hand expression. Has crusty nipples w/light colored areolas. Mom states she had very slight change in breast during pregnancy. Mom states she doesn't like any one or thing touching her nipples. \ Mom encouraged to feed baby 8-12 times/24 hours and with feeding cues.  Mom shown how to use DEBP & how to disassemble, clean, & reassemble parts. Mom has Medela at home. Mom stated that she mainly wanted to pump instead of BF.  WH/LC brochure given w/resources, support groups and LC services. Patient Name: Boy Martisha Toulouse [id number] Date: 02/17/2017 Reason for consult: Initial assessment   Maternal Data Has patient been taught Hand Expression?: Yes Does the patient have breastfeeding experience prior to this delivery?: No  Feeding Feeding Type: Formula Nipple Type: Slow - flow  LATCH Score/Interventions Latch: Repeated attempts needed to sustain latch, nipple held in mouth throughout feeding, stimulation needed to elicit sucking reflex. Intervention(s): Adjust position;Assist with latch;Breast massage  Audible Swallowing: None Intervention(s): Skin to skin;Hand expression  Type of Nipple: Everted at rest and after stimulation (short shaft)  Comfort (Breast/Nipple): Filling, red/small blisters or bruises, mild/mod discomfort  Problem noted: Mild/Moderate discomfort Interventions (Mild/moderate discomfort): Hand massage;Hand expression;Post-pump  Hold (Positioning): Assistance needed to correctly position infant at breast and maintain latch. Intervention(s): Breastfeeding basics reviewed;Support Pillows;Position options;Skin to skin  LATCH Score: 5  Lactation Tools Discussed/Used Tools: Pump Breast pump type: Double-Electric Breast Pump Pump Review: Milk Storage;Setup, frequency, and cleaning Initiated by:: Peri Jefferson  RN IBCLC Date initiated:: 02/17/17   Consult Status Consult Status: Follow-up Date: 02/18/17 Follow-up type: In-patient    Charyl Dancer 02/17/2017, 6:26 AM

## 2017-02-17 NOTE — Progress Notes (Signed)
Pt c/o of feeling pressure and not being able to empty her bladder completely. When questioned further, pt reports only voiding small amts. Pt had just returned from voiding so bladder scanned and showed +999.  I/O cath done and 1600 cc returned. Pt instructed to drink plenty of water and void q2 hrs- will measure output to monitor if bladder being emptied. Pt verbalized understanding.

## 2017-02-17 NOTE — Progress Notes (Signed)
Patient informed of note written by pediatrician when questioned results of baby's xray

## 2017-02-17 NOTE — Lactation Note (Signed)
This note was copied from a baby's chart. Lactation Consultation Note  Patient Name: Maria Morales Date: 02/17/2017 Reason for consult: Follow-up assessment Baby at 19 hr of life. Mom stated she is no longer latching baby. She will "try" pumping. She has only pumped twice today. She does not like the feeling of latching or pumping. Discussed breast changes and drying up milk supply. She is aware of lactation services and support group. She will call as needed   Maternal Data Formula Feeding for Exclusion: Yes  Feeding Feeding Type: Formula Nipple Type: Slow - flow  LATCH Score/Interventions                      Lactation Tools Discussed/Used     Consult Status Consult Status: PRN    Rulon Eisenmenger 02/17/2017, 9:04 PM

## 2017-02-17 NOTE — Progress Notes (Signed)
Bladder scanned for 1000 ml  Patient up and voided

## 2017-02-17 NOTE — Anesthesia Postprocedure Evaluation (Signed)
Anesthesia Post Note  Patient: Maria Morales  Procedure(s) Performed: * No procedures listed *  Patient location during evaluation: Mother Baby Anesthesia Type: Epidural Level of consciousness: awake Pain management: pain level controlled Vital Signs Assessment: post-procedure vital signs reviewed and stable Respiratory status: spontaneous breathing Cardiovascular status: stable Postop Assessment: no headache, no backache, epidural receding and patient able to bend at knees Anesthetic complications: no        Last Vitals:  Vitals:   02/17/17 0346 02/17/17 0444  BP: 116/74 108/68  Pulse: (!) 104 (!) 101  Resp: 18 18  Temp: 36.8 C 36.7 C    Last Pain:  Vitals:   02/17/17 0825  TempSrc:   PainSc: 5    Pain Goal:                 Edison Pace

## 2017-02-18 ENCOUNTER — Ambulatory Visit (HOSPITAL_COMMUNITY): Payer: BC Managed Care – PPO

## 2017-02-18 NOTE — Progress Notes (Signed)
Post Partum Day 1 Subjective: Patient had foley replaced last night d/t urinary retention. Bleeding appropriate. Bottle feeding  Objective: Blood pressure 102/60, pulse 73, temperature 98.3 F (36.8 C), temperature source Oral, resp. rate 18, height  (1.626 m), weight 77.6 kg (171 lb), last menstrual period 05/26/2016, SpO2 99 %, unknown if currently breastfeeding.  Physical Exam:  General: alert, cooperative and appears stated age Lochia: appropriate Uterine Fundus: firm DVT Evaluation: No evidence of DVT seen on physical exam.   Recent Labs  02/16/17 0125 02/17/17 0543  HGB 9.2* 8.9*  HCT 29.3* 27.0*    Assessment/Plan: Plan for discharge tomorrow  Remove foley catheter at 11 this am, will be 12 hrs since replacement. If unable to void then will replace for full 24 hrs   LOS: 2 days   Aldan Camey H. 02/18/2017, 9:41 AM

## 2017-02-18 NOTE — Progress Notes (Signed)
Notified Dr. Mora Appl of I&O catheter at [facility] that drained 1600 ml and that pt had attempted to void at 2150 and only voided ~50 ml. Dr. Mora Appl ordered foley catheter to be placed now and left in until AM. Inserted catheter as charted and educated pt on catheter care.

## 2017-02-19 ENCOUNTER — Ambulatory Visit (INDEPENDENT_AMBULATORY_CARE_PROVIDER_SITE_OTHER): Payer: Self-pay | Admitting: Surgery

## 2017-02-19 MED ORDER — OXYCODONE-ACETAMINOPHEN 5-325 MG PO TABS: 1.0000 | ORAL_TABLET | ORAL | 0 refills | Status: DC | PRN

## 2017-02-19 NOTE — Discharge Summary (Signed)
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 Schedule an appointment as soon as possible for a visit in 1 month(s).   Specialty:  Obstetrics and Gynecology Contact information: [address] [place] [address] [phone]           Newborn Data: Live born female  Birth Weight: 6 lb 4.9 oz (2860 g) APGAR: 8, 9  Home with mother.  Maria Morales,Maria Morales 02/19/2017, 9:05 AM

## 2017-02-19 NOTE — Progress Notes (Signed)
PPD#2 Pt is doing well . Lochia wnl. Now voiding properly VSSAF IMP/ Stable Plan/ Will discharge.

## 2017-02-23 ENCOUNTER — Inpatient Hospital Stay (HOSPITAL_COMMUNITY): Payer: BC Managed Care – PPO

## 2017-03-19 ENCOUNTER — Other Ambulatory Visit: Payer: Self-pay | Admitting: Obstetrics and Gynecology

## 2018-04-07 ENCOUNTER — Encounter (HOSPITAL_COMMUNITY): Payer: Self-pay | Admitting: Family Medicine

## 2018-04-07 ENCOUNTER — Ambulatory Visit (HOSPITAL_COMMUNITY)
Admission: EM | Admit: 2018-04-07 | Discharge: 2018-04-07 | Disposition: A | Discharge status: Home / Self Care | Payer: BC Managed Care – PPO | Attending: Family Medicine | Admitting: Family Medicine

## 2018-04-07 DIAGNOSIS — B351 Tinea unguium: Secondary | ICD-10-CM | POA: Diagnosis not present

## 2018-04-07 MED ORDER — TERBINAFINE HCL 250 MG PO TABS: 250.0000 mg | ORAL_TABLET | Freq: Every day | ORAL | 0 refills | Status: AC

## 2018-04-07 NOTE — Discharge Instructions (Signed)
Please begin terbinafine daily for 12 weeks  Keep toe trimmed short  Keep dry  Return if symptoms worsening or not improving.

## 2018-04-07 NOTE — ED Triage Notes (Signed)
Pt here for possible fungus to the right great toe. She reports discoloration and the nail is raised up.

## 2018-04-07 NOTE — ED Provider Notes (Signed)
[facility]    CSN: [id number] Arrival date & time: 04/07/18  1652     History   Chief Complaint Chief Complaint  Patient presents with  . Nail Problem    HPI Maria Morales is a 27 y.o. female denies history past medical history presenting today for evaluation of toe infection.  Patient is concerned about a fungal infection.  She has noticed her right great toenail turning yellow and beginning to separate from the nailbed.  This is been going on for about 1 month, but more recently has worsened and began separating.  She denies any pain.  She has been using an over-the-counter gel without relief.  HPI  Past Medical History:  Diagnosis Date  . Medical history non-contributory     Patient Active Problem List   Diagnosis Date Noted  . Postpartum state 02/17/2017  . Pregnancy 02/16/2017    Past Surgical History:  Procedure Laterality Date  . NO PAST SURGERIES      OB History    Gravida  2   Para  1   Term  1   Preterm      AB  1   Living  1     SAB  1   TAB      Ectopic      Multiple  0   Live Births  1            Home Medications    Prior to Admission medications   Medication Sig Start Date End Date Taking? Authorizing Provider  Prenatal Vit-Fe Fumarate-FA (PRENATAL MULTIVITAMIN) TABS tablet Take 1 tablet by mouth daily at 12 noon.    [provider]  terbinafine (LAMISIL) 250 MG tablet Take 1 tablet (250 mg total) by mouth daily. For 12 weeks 04/07/18 06/30/18  Lew Dawes, PA-C    Family History History reviewed. No pertinent family history.  Social History Social History   Tobacco Use  . Smoking status: Never Smoker  . Smokeless tobacco: Never Used  Substance Use Topics  . Alcohol use: No  . Drug use: No     Allergies   Patient has no known allergies.   Review of Systems Review of Systems  Constitutional: Negative for fatigue and fever.  Respiratory: Negative for shortness of breath.     Cardiovascular: Negative for chest pain.  Gastrointestinal: Negative for abdominal pain, nausea and vomiting.  Musculoskeletal: Negative for arthralgias, gait problem, joint swelling and myalgias.  Skin: Positive for color change. Negative for rash and wound.       Nail discoloration  Neurological: Negative for dizziness, weakness, light-headedness, numbness and headaches.     Physical Exam Triage Vital Signs ED Triage Vitals  Enc Vitals Group     BP 04/07/18 1705 119/76     Pulse Rate 04/07/18 1705 85     Resp 04/07/18 1705 18     Temp --      Temp src --      SpO2 04/07/18 1705 100 %     Weight --      Height --      Head Circumference --      Peak Flow --      Pain Score 04/07/18 1704 3     Pain Loc --      Pain Edu? --      Excl. in GC? --    No data found.  Updated Vital Signs BP 119/76   Pulse 85   Resp 18  LMP 04/07/2018   SpO2 100%   Visual Acuity Right Eye Distance:   Left Eye Distance:   Bilateral Distance:    Right Eye Near:   Left Eye Near:    Bilateral Near:     Physical Exam  Constitutional: She appears well-developed and well-nourished. No distress.  HENT:  Head: Normocephalic and atraumatic.  Eyes: Conjunctivae are normal.  Neck: Neck supple.  Cardiovascular: Normal rate.  Pulmonary/Chest: Effort normal. No respiratory distress.  Musculoskeletal: She exhibits no edema.  Neurological: She is alert.  Skin: Skin is warm and dry.  Right great toe with yellow discoloration, mild separation from nail bed, minimal amount of erythema surrounding medial, lateral nail fold as well as cuticle area.  Nontender to palpation.  Psychiatric: She has a normal mood and affect.  Nursing note and vitals reviewed.    UC Treatments / Results  Labs (all labs ordered are listed, but only abnormal results are displayed) Labs Reviewed - No data to display  EKG None  Radiology No results found.  Procedures Procedures (including critical care  time)  Medications Ordered in UC Medications - No data to display  Initial Impression / Assessment and Plan / UC Course  I have reviewed the triage vital signs and the nursing notes.  Pertinent labs & imaging results that were available during my care of the patient were reviewed by me and considered in my medical decision making (see chart for details).     Patient with what appears to be onychomycosis of the right great toe.  Will initiate oral terbinafine to take daily for 12 weeks.  Discussed keeping toe short in order to avoid detachment.  Return if developing signs of paronychia, discussed signs and symptoms of this.Discussed strict return precautions. Patient verbalized understanding and is agreeable with plan.  Final Clinical Impressions(s) / UC Diagnoses   Final diagnoses:  Onychomycosis of great toe     Discharge Instructions     Please begin terbinafine daily for 12 weeks  Keep toe trimmed short  Keep dry  Return if symptoms worsening or not improving.   ED Prescriptions    Medication Sig Dispense Auth. Provider   terbinafine (LAMISIL) 250 MG tablet Take 1 tablet (250 mg total) by mouth daily. For 12 weeks 84 tablet Leenah Seidner C, PA-C     Controlled Substance Prescriptions [place] Controlled Substance Registry consulted? Not Applicable   Lew DawesWieters, Theus Espin C, [place]PA-C 04/07/18 1749

## 2018-05-01 IMAGING — US US MFM FETAL BPP W/O NON-STRESS
1 series · 13 of 28 positions shown · non-contrast
Comparison: none

[Series 1: us mfm fetal bpp w/o non-stress · 100 acquisitions, 13 frames shown]
[im 4/100]
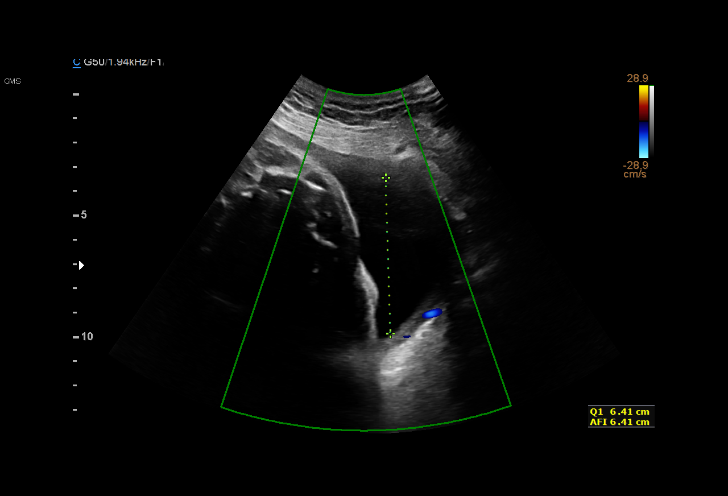
[im 12/100]
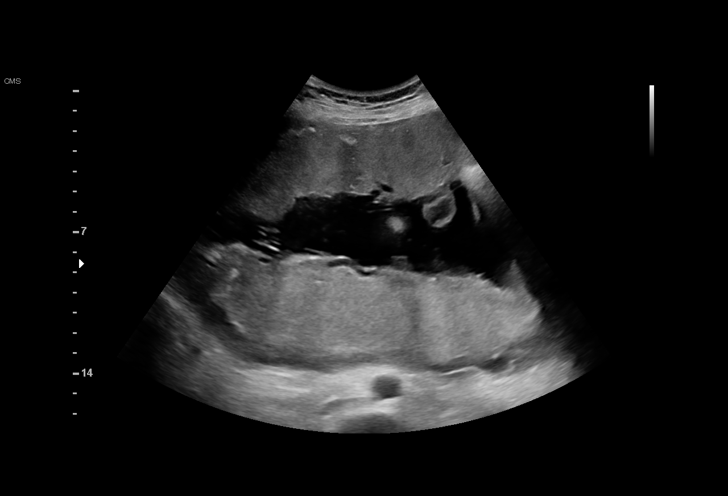
[im 19/100]
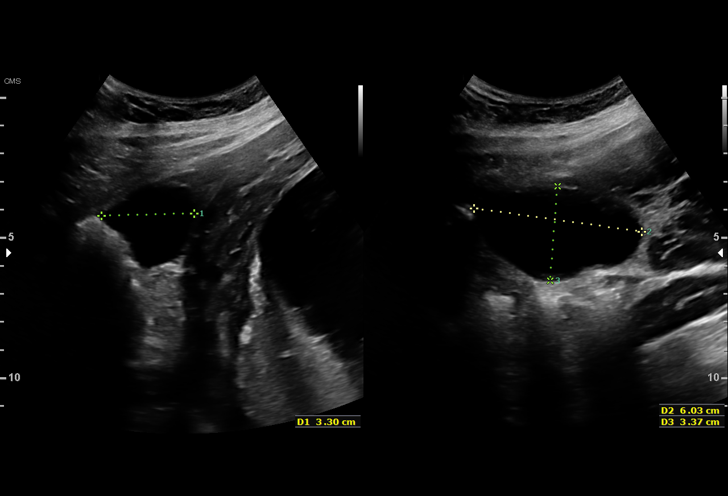
[im 26/100]
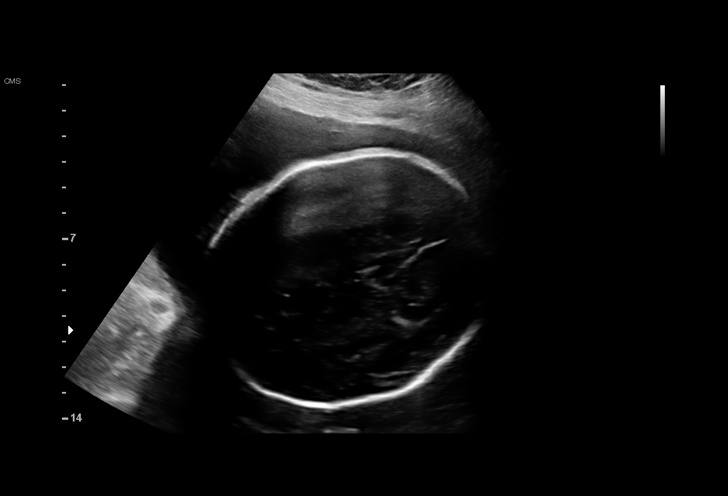
[im 34/100]
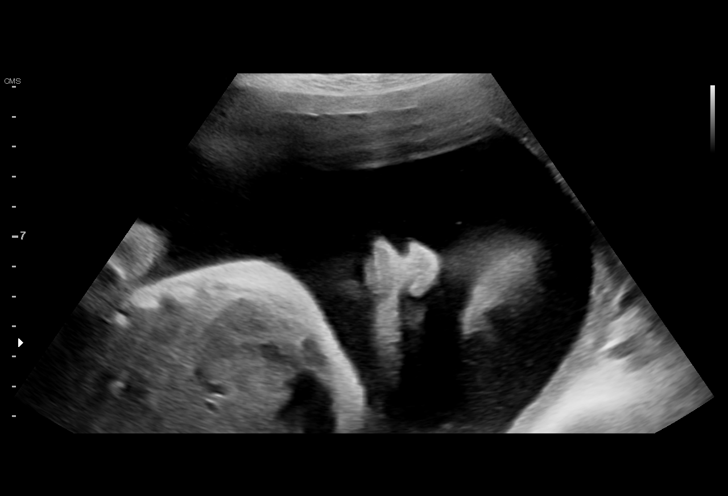
[im 41/100]
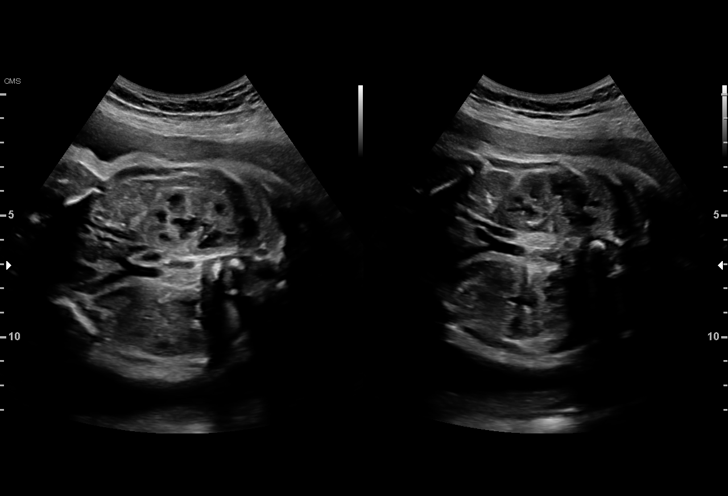
[im 52/100]
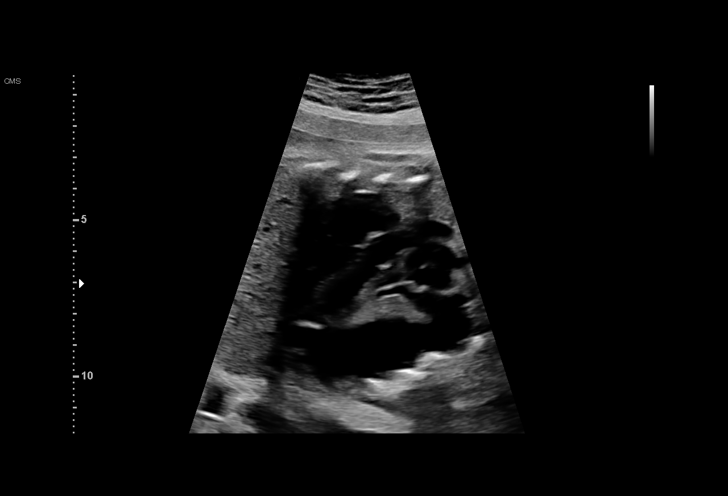
[im 59/100]
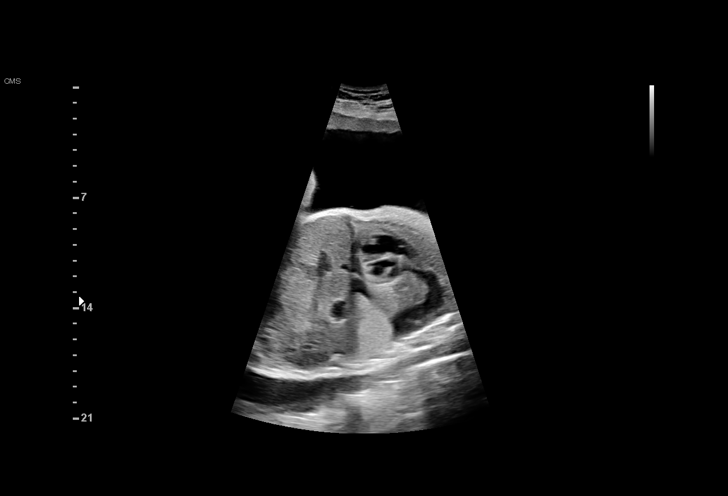
[im 67/100]
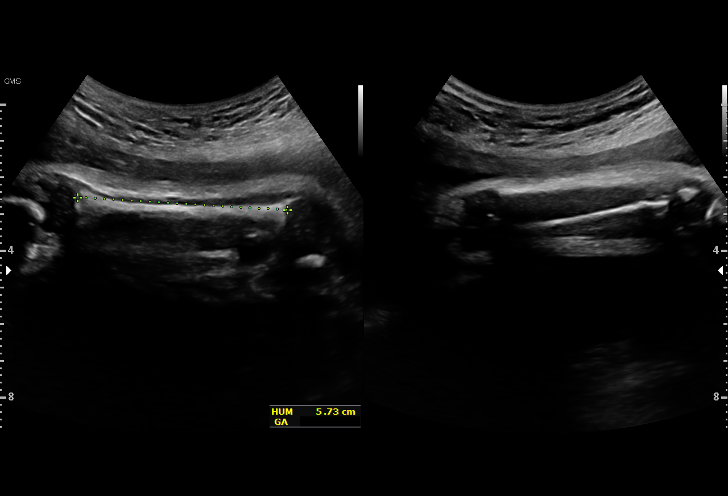
[im 74/100]
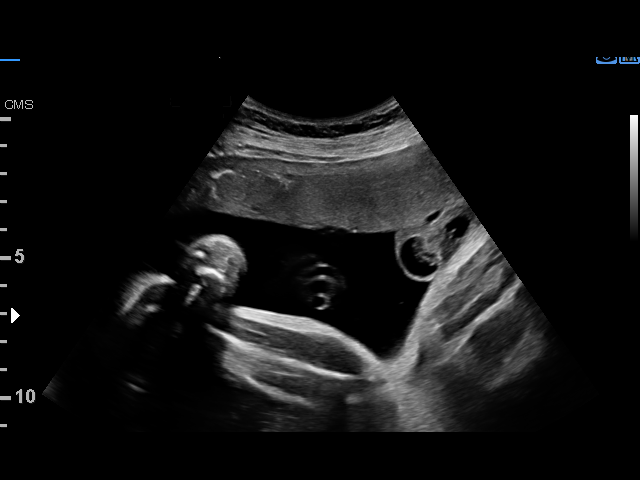
[im 81/100]
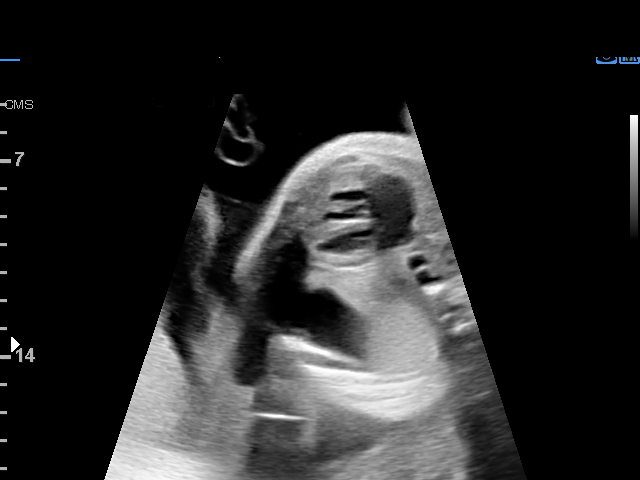
[im 89/100]
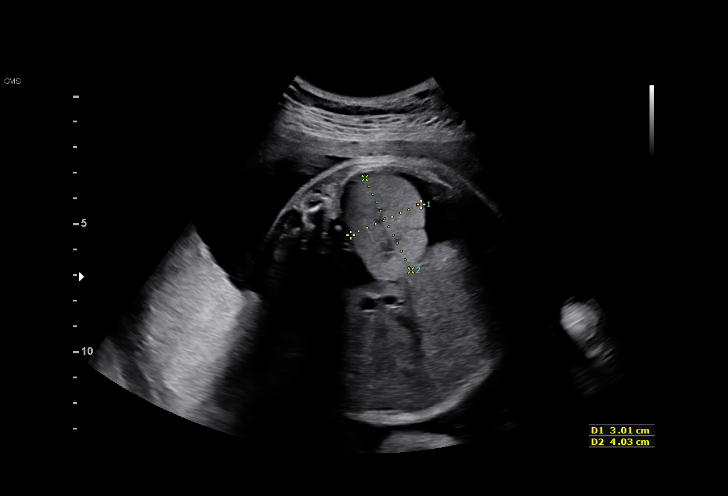
[im 96/100]
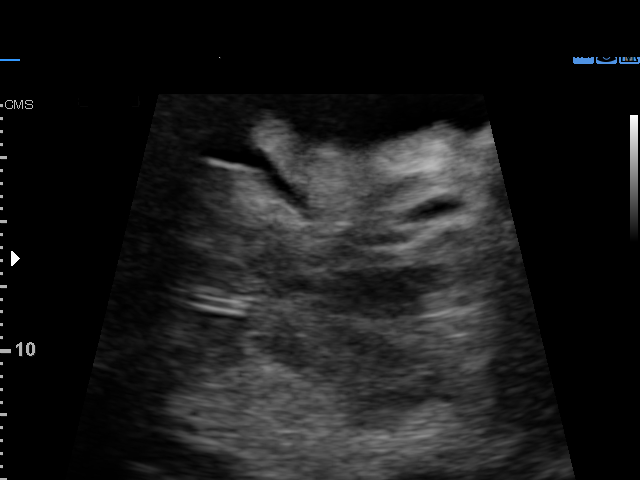

[13 of 28 positions shown; findings below may reference images not displayed]

Road; [HOSPITAL]

1  DONISHA SNOOK            [id number]      [id number]     [id number]
2  DONISHA SNOOK            [id number]      [id number]     [id number]
Indications

34 weeks gestation of pregnancy
Encounter for fetal anatomic survey
Polyhydramnios, third trimester, antepartum
condition or complication, unspecified fetus
Abnormal fetal ultrasound
Fetal abnormality - other known or
suspected (pleural effusion)
OB History

Gravidity:    2         Term:   0        Prem:   0         SAB:   1
TOP:          0       Ectopic:  0        Living: 0
Fetal Evaluation

Num Of Fetuses:     1
Fetal Heart         124
Rate(bpm):
Cardiac Activity:   Observed
Presentation:       Cephalic
Placenta:           Posterior Fundal, above cervical os
P. Cord Insertion:  Visualized, central

Amniotic Fluid
AFI FV:      Polyhydramnios

AFI Sum(cm)     %Tile       Largest Pocket(cm)
31.6            > 97

RUQ(cm)       RLQ(cm)       LUQ(cm)        LLQ(cm)
6.41
Biophysical Evaluation

Amniotic F.V:   Polyhydramnios             F. Tone:         Observed
F. Movement:    Observed                   Score:           [DATE]
F. Breathing:   Observed
Biometry

BPD:      94.4  mm     G. Age:  38w 3d       > 99  %    CI:         77.03  %    70 - 86
FL/HC:       18.8  %    19.4 -
HC:      340.6  mm     G. Age:  39w 2d       > 97  %    HC/AC:       1.03       0.96 -
AC:      329.6  mm     G. Age:  36w 6d       > 97  %    FL/BPD:      67.8  %    71 - 87
FL:         64  mm     G. Age:  33w 1d         12  %    FL/AC:       19.4  %    20 - 24
HUM:      57.3  mm     G. Age:  33w 2d         37  %
CER:      47.1  mm     G. Age:  N/A          > 95  %

Est. FW:    1601   gm     6 lb 7 oz     88  %
Gestational Age

LMP:           34w 3d        Date:  05/26/16                 EDD:    03/02/17
U/S Today:     37w 0d                                        EDD:    02/12/17
Best:          34w 3d     Det. By:  LMP  (05/26/16)          EDD:    03/02/17
Anatomy

Cranium:               Appears normal         Aortic Arch:            Appears normal
Cavum:                 Appears normal         Ductal Arch:            Appears normal
Ventricles:            Appears normal         Diaphragm:              Appears normal
Choroid Plexus:        Appears normal         Stomach:                Appears normal, left
sided
Cerebellum:            Appears normal         Abdomen:                Appears normal
Posterior Fossa:       Appears normal         Abdominal Wall:         Appears nml (cord
insert, abd wall)
Nuchal Fold:           Not applicable (>20    Cord Vessels:           Appears normal (3
wks GA)                                        vessel cord)
Face:                  Appears normal         Kidneys:                Appear normal
(orbits and profile)
Lips:                  Appears normal         Bladder:                Appears normal
Thoracic:              Pleural effusion;      Spine:                  Appears normal
chest mass on lt
Heart:                 Mediastinal shift;     Upper Extremities:      RUE appears
heart in right chest                           normal; LUE ltd
views
RVOT:                  Appears normal         Lower Extremities:      Appears normal
LVOT:                  Appears normal

Other:  Fetus appears to be a male. Heels visualized. Technically difficult due
to advanced gestational age.
Doppler - Fetal Vessels

Middle Cerebral Artery
PSV   MoM
(cm/s)
53

Cervix Uterus Adnexa
Cervix
Not visualized (advanced GA >49wks)

Uterus
No abnormality visualized.

Left Ovary
Within normal limits.

Right Ovary
Within normal limits.

Cul De Sac:   No free fluid seen.

Adnexa:       No abnormality visualized.
Impression

SIUP at 34+3 weeks
Solid, homogeneous, left lower chest mass (3.0 x 4.0 x
cms) with feeder vessel c/w bronchopulmonary
sequestration; secondary large pleural effusion (2.1 cms) on
left; mediastinal shift with heart in right chest
All other detailed fetal anatomy was seen and appeared
normal
Mild polyhydramnios
Measurements consistent with LMP dating; EFW at the 88th
%tile
BPP [DATE]
(Normal MCA dopplers - no fetal anemia)

The US findings were shared with Ms. Ragan and her
husband. The implications of the mass, pleural effusion and
mediastinal shift were discussed in detail. We discussed
tapping the effusion and giving a course of BMZ. Dr. Filimonovas
then talked with them about the risks and benefits of the
thoracentesis procedure. Ms. Ragan took some time to
decided but agreed to the procedure.
Recommendations

Return for thoracentesis on [REDACTED]
First BMZ given today; second in the [facility] tomorrow

## 2018-09-27 ENCOUNTER — Encounter (HOSPITAL_COMMUNITY): Payer: Self-pay | Admitting: Emergency Medicine

## 2018-09-27 ENCOUNTER — Ambulatory Visit (HOSPITAL_COMMUNITY)
Admission: EM | Admit: 2018-09-27 | Discharge: 2018-09-27 | Disposition: A | Discharge status: Home / Self Care | Payer: BC Managed Care – PPO | Attending: Family Medicine | Admitting: Family Medicine

## 2018-09-27 ENCOUNTER — Other Ambulatory Visit: Payer: Self-pay

## 2018-09-27 DIAGNOSIS — K529 Noninfective gastroenteritis and colitis, unspecified: Secondary | ICD-10-CM | POA: Diagnosis not present

## 2018-09-27 DIAGNOSIS — R109 Unspecified abdominal pain: Secondary | ICD-10-CM | POA: Diagnosis not present

## 2018-09-27 DIAGNOSIS — Z3202 Encounter for pregnancy test, result negative: Secondary | ICD-10-CM | POA: Diagnosis not present

## 2018-09-27 DIAGNOSIS — R1110 Vomiting, unspecified: Secondary | ICD-10-CM | POA: Diagnosis not present

## 2018-09-27 LAB — POCT I-STAT, CHEM 8
BUN: 14 mg/dL (ref 6–20)
Calcium, Ion: 1.14 mmol/L — ABNORMAL LOW (ref 1.15–1.40)
Chloride: 108 mmol/L (ref 98–111)
Creatinine, Ser: 0.7 mg/dL (ref 0.44–1.00)
GLUCOSE: 139 mg/dL — AB (ref 70–99)
HCT: 44 % (ref 36.0–46.0)
Hemoglobin: 15 g/dL (ref 12.0–15.0)
POTASSIUM: 3.6 mmol/L (ref 3.5–5.1)
Sodium: 141 mmol/L (ref 135–145)
TCO2: 20 mmol/L — ABNORMAL LOW (ref 22–32)

## 2018-09-27 LAB — POCT URINALYSIS DIP (DEVICE)
Glucose, UA: NEGATIVE mg/dL
Ketones, ur: >=160 mg/dL — AB
LEUKOCYTES UA: NEGATIVE
NITRITE: NEGATIVE
PH: 5.5 (ref 5.0–8.0)
Protein, ur: 30 mg/dL — AB
Specific Gravity, Urine: >=1.03 (ref 1.005–1.030)
Urobilinogen, UA: 0.2 mg/dL (ref 0.0–1.0)

## 2018-09-27 LAB — POCT PREGNANCY, URINE: PREG TEST UR: NEGATIVE

## 2018-09-27 MED ORDER — ONDANSETRON 4 MG PO TBDP
ORAL_TABLET | ORAL | Status: AC
  Filled 2018-09-27: qty 1

## 2018-09-27 MED ORDER — ONDANSETRON 4 MG PO TBDP
8.0000 mg | ORAL_TABLET | Freq: Once | ORAL | Status: AC
  Administered 2018-09-27: 8 mg via ORAL

## 2018-09-27 MED ORDER — ONDANSETRON HCL 4 MG PO TABS: 4.0000 mg | ORAL_TABLET | Freq: Three times a day (TID) | ORAL | 0 refills | Status: DC | PRN

## 2018-09-27 MED ORDER — SODIUM CHLORIDE 0.9 % IV BOLUS
1000.0000 mL | Freq: Once | INTRAVENOUS | Status: AC
  Administered 2018-09-27: 1000 mL via INTRAVENOUS

## 2018-09-27 MED ORDER — ONDANSETRON 4 MG PO TBDP
ORAL_TABLET | ORAL | Status: AC
  Filled 2018-09-27: qty 2

## 2018-09-27 NOTE — Discharge Instructions (Signed)
Push fluids Take the zofran as needed for nausea and vomiting Add in bland foods as tolerated May use imodium if needed diarrhea Wash hands frequently Return for any worsening pain or uncontrollable vomiting

## 2018-09-27 NOTE — ED Notes (Signed)
Pt ambulated to bathroom with husband and had syncopal episode on toilet; SN notified and pt placed on stretcher

## 2018-09-27 NOTE — ED Triage Notes (Signed)
Vomiting started at 1 pm today.  Patient has abdominal pain and back pain, has body aches.  Patient has had diarrhea as well.

## 2018-09-27 NOTE — ED Provider Notes (Signed)
[facility]    CSN: [id number] Arrival date & time: 09/27/18  1751     History   Chief Complaint Chief Complaint  Patient presents with  . Vomiting    HPI Maria Morales is a 27 y.o. female.   HPI  Healthy 27 year old on no medicines.  Felt well when she got up this morning.  Ate cereal.  Shortly after that started vomiting.  Has been actively vomiting all day long.  Has not kept down even water.  Shortly thereafter she started with watery diarrhea.  She has had "too many to count" watery stools.  She is having crampy abdominal pain.  It radiates to her back.  She is tearful.  No blood in emesis.  No blood in bowels.  No known exposure to illness.  No recent travel.  No recent antibiotics.  No new medicines or foods.  Past Medical History:  Diagnosis Date  . Medical history non-contributory     Patient Active Problem List   Diagnosis Date Noted  . Postpartum state 02/17/2017  . Pregnancy 02/16/2017    Past Surgical History:  Procedure Laterality Date  . NO PAST SURGERIES      OB History    Gravida  2   Para  1   Term  1   Preterm      AB  1   Living  1     SAB  1   TAB      Ectopic      Multiple  0   Live Births  1            Home Medications    Prior to Admission medications   Medication Sig Start Date End Date Taking? Authorizing Provider  ondansetron (ZOFRAN) 4 MG tablet Take 1-2 tablets (4-8 mg total) by mouth every 8 (eight) hours as needed for nausea or vomiting. 09/27/18   Eustace Moore, MD    Family History Family History  Problem Relation Age of Onset  . Hypertension Father     Social History Social History   Tobacco Use  . Smoking status: Never Smoker  . Smokeless tobacco: Never Used  Substance Use Topics  . Alcohol use: No  . Drug use: No     Allergies   Patient has no known allergies.   Review of Systems Review of Systems  Constitutional: Negative for chills and fever.  HENT: Negative  for ear pain and sore throat.   Eyes: Negative for pain and visual disturbance.  Respiratory: Negative for cough and shortness of breath.   Cardiovascular: Negative for chest pain and palpitations.  Gastrointestinal: Positive for abdominal pain, diarrhea, nausea and vomiting.  Genitourinary: Negative for dysuria and hematuria.  Musculoskeletal: Positive for back pain. Negative for arthralgias.  Skin: Negative for color change and rash.  Neurological: Negative for seizures and syncope.  All other systems reviewed and are negative.    Physical Exam Triage Vital Signs ED Triage Vitals  Enc Vitals Group     BP 09/27/18 1816 102/69     Pulse Rate 09/27/18 1816 (!) 103     Resp 09/27/18 1816 (!) 22     Temp 09/27/18 1816 98.2 F (36.8 C)     Temp Source 09/27/18 1816 Oral     SpO2 09/27/18 1816 100 %     Weight --      Height --      Head Circumference --      Peak Flow --  Pain Score 09/27/18 1814 10     Pain Loc --      Pain Edu? --      Excl. in GC? --    No data found.  Updated Vital Signs BP 102/69 (BP Location: Left Arm)   Pulse (!) 103   Temp 98.2 F (36.8 C) (Oral)   Resp (!) 22   LMP 09/27/2018   SpO2 100%       Physical Exam  Constitutional: She appears well-developed and well-nourished. She appears distressed.  Tearful and actively vomiting  HENT:  Head: Normocephalic and atraumatic.  Mouth/Throat: Oropharynx is clear and moist.  Eyes: Pupils are equal, round, and reactive to light. Conjunctivae are normal.  Neck: Normal range of motion.  Cardiovascular: Regular rhythm and normal heart sounds.  Tachycardia  Pulmonary/Chest: Effort normal and breath sounds normal. No respiratory distress.  Abdominal: Soft. She exhibits no distension. There is tenderness.  Active bowel sounds.  Diffuse tenderness  Musculoskeletal: Normal range of motion. She exhibits no edema.  Neurological: She is alert.  Skin: Skin is warm and dry.  Psychiatric: She has a normal  mood and affect. Her behavior is normal.  Normal for stated illness     UC Treatments / Results  Labs (all labs ordered are listed, but only abnormal results are displayed) Labs Reviewed  POCT URINALYSIS DIP (DEVICE) - Abnormal; Notable for the following components:      Result Value   Bilirubin Urine SMALL (*)    Ketones, ur >=160 (*)    Hgb urine dipstick LARGE (*)    Protein, ur 30 (*)    All other components within normal limits  POCT I-STAT, CHEM 8 - Abnormal; Notable for the following components:   Glucose, Bld 139 (*)    Calcium, Ion 1.14 (*)    TCO2 20 (*)    All other components within normal limits  POCT PREGNANCY, URINE    EKG None  Radiology No results found.  Procedures Procedures (including critical care time)  Medications Ordered in UC Medications  sodium chloride 0.9 % bolus 1,000 mL (has no administration in time range)  ondansetron (ZOFRAN-ODT) disintegrating tablet 8 mg (8 mg Oral Given 09/27/18 1833)    Initial Impression / Assessment and Plan / UC Course  I have reviewed the triage vital signs and the nursing notes.  Pertinent labs & imaging results that were available during my care of the patient were reviewed by me and considered in my medical decision making (see chart for details).    Patient was actively vomiting, uncomfortable with abdominal pain upon arrival.  Was given Zofran 8 mg p.o.  She made several trips to the bathroom.  On her third trip to the bathroom, she fainted on the commode.  Her husband was with her and kept her from falling.  We put her into the holding room and started an IV.  Did a Chem-8.  It was normal.  After 1 L of fluid she felt dramatically better.  She is given a second liter of fluids.  She was observed in the clinic for over an hour with frequent nursing and medical visits. Final Clinical Impressions(s) / UC Diagnoses   Final diagnoses:  Gastroenteritis, infectious, presumed     Discharge Instructions       Push fluids Take the zofran as needed for nausea and vomiting Add in bland foods as tolerated May use imodium if needed diarrhea Wash hands frequently Return for any worsening pain or uncontrollable  vomiting    ED Prescriptions    Medication Sig Dispense Auth. Provider   ondansetron (ZOFRAN) 4 MG tablet Take 1-2 tablets (4-8 mg total) by mouth every 8 (eight) hours as needed for nausea or vomiting. 12 tablet Eustace MooreNelson, Stevie Ertle Sue, MD     Controlled Substance Prescriptions  Controlled Substance Registry consulted? Not Applicable  Greater than 50% of this visit was spent in counseling and coordinating care.  Total face to face time:   45 we discussed dehydration.  Abdominal pain.  Reasons for returning to the office.  Signs and symptoms to watch for.  Prevention of virus and family members.   Eustace MooreNelson, Tiena Manansala Sue, MD 09/27/18 [phone]

## 2020-08-18 ENCOUNTER — Encounter (HOSPITAL_COMMUNITY): Payer: Self-pay | Admitting: Obstetrics and Gynecology

## 2020-08-18 ENCOUNTER — Inpatient Hospital Stay (HOSPITAL_COMMUNITY)
Admission: AD | Admit: 2020-08-18 | Discharge: 2020-08-18 | Disposition: A | Discharge status: Home / Self Care | Payer: Commercial Managed Care - PPO | Attending: Obstetrics and Gynecology | Admitting: Obstetrics and Gynecology

## 2020-08-18 ENCOUNTER — Inpatient Hospital Stay (HOSPITAL_COMMUNITY): Payer: Commercial Managed Care - PPO

## 2020-08-18 ENCOUNTER — Other Ambulatory Visit: Payer: Self-pay

## 2020-08-18 DIAGNOSIS — O3481 Maternal care for other abnormalities of pelvic organs, first trimester: Secondary | ICD-10-CM | POA: Insufficient documentation

## 2020-08-18 DIAGNOSIS — O208 Other hemorrhage in early pregnancy: Secondary | ICD-10-CM

## 2020-08-18 DIAGNOSIS — R109 Unspecified abdominal pain: Secondary | ICD-10-CM | POA: Diagnosis not present

## 2020-08-18 DIAGNOSIS — O209 Hemorrhage in early pregnancy, unspecified: Secondary | ICD-10-CM | POA: Insufficient documentation

## 2020-08-18 DIAGNOSIS — O039 Complete or unspecified spontaneous abortion without complication: Secondary | ICD-10-CM | POA: Diagnosis not present

## 2020-08-18 DIAGNOSIS — O26891 Other specified pregnancy related conditions, first trimester: Secondary | ICD-10-CM | POA: Diagnosis not present

## 2020-08-18 DIAGNOSIS — N83201 Unspecified ovarian cyst, right side: Secondary | ICD-10-CM

## 2020-08-18 DIAGNOSIS — O99891 Other specified diseases and conditions complicating pregnancy: Secondary | ICD-10-CM | POA: Diagnosis not present

## 2020-08-18 DIAGNOSIS — O3680X0 Pregnancy with inconclusive fetal viability, not applicable or unspecified: Secondary | ICD-10-CM | POA: Diagnosis not present

## 2020-08-18 DIAGNOSIS — O4691 Antepartum hemorrhage, unspecified, first trimester: Secondary | ICD-10-CM

## 2020-08-18 DIAGNOSIS — Z3A01 Less than 8 weeks gestation of pregnancy: Secondary | ICD-10-CM

## 2020-08-18 LAB — URINALYSIS, ROUTINE W REFLEX MICROSCOPIC
Bilirubin Urine: NEGATIVE
Glucose, UA: NEGATIVE mg/dL
Ketones, ur: NEGATIVE mg/dL
Nitrite: NEGATIVE
Protein, ur: NEGATIVE mg/dL
Specific Gravity, Urine: 1.016 (ref 1.005–1.030)
pH: 7 (ref 5.0–8.0)

## 2020-08-18 LAB — WET PREP, GENITAL
Clue Cells Wet Prep HPF POC: NONE SEEN
Sperm: NONE SEEN
Trich, Wet Prep: NONE SEEN
Yeast Wet Prep HPF POC: NONE SEEN

## 2020-08-18 LAB — CBC
HCT: 39.5 % (ref 36.0–46.0)
Hemoglobin: 13.4 g/dL (ref 12.0–15.0)
MCH: 30.4 pg (ref 26.0–34.0)
MCHC: 33.9 g/dL (ref 30.0–36.0)
MCV: 89.6 fL (ref 80.0–100.0)
Platelets: 226 10*3/uL (ref 150–400)
RBC: 4.41 MIL/uL (ref 3.87–5.11)
RDW: 12.2 % (ref 11.5–15.5)
WBC: 6.3 10*3/uL (ref 4.0–10.5)
nRBC: 0 % (ref 0.0–0.2)

## 2020-08-18 LAB — HIV ANTIBODY (ROUTINE TESTING W REFLEX): HIV Screen 4th Generation wRfx: NONREACTIVE

## 2020-08-18 LAB — HCG, QUANTITATIVE, PREGNANCY: hCG, Beta Chain, Quant, S: 243 m[IU]/mL — ABNORMAL HIGH (ref <5)

## 2020-08-18 LAB — POCT PREGNANCY, URINE: Preg Test, Ur: POSITIVE — AB

## 2020-08-18 NOTE — MAU Note (Signed)
Discharge instructions reviewed with patient, patient to follow up with labs in the clinic. Patient understands to return if VB, increased abdominal pain, fever.

## 2020-08-18 NOTE — MAU Note (Signed)
Started bleeding yesterday AM, not continuous, with wiping

## 2020-08-18 NOTE — MAU Provider Note (Signed)
Chief Complaint: Vaginal Bleeding and Abdominal Pain   First Provider Initiated Contact with Patient 08/18/20 1004     SUBJECTIVE HPI: Maria Morales is a 29 y.o. G3P1011 at [redacted]w[redacted]d who presents to Maternity Admissions reporting:  Vaginal Bleeding: light since yesterday Passage of tissue or clots: Two small clots Dizziness: Denies  Has not had any US's or labs this pregnancy besides + UPT. Has Appt scheduled at [facility]. Is an established pt of theirs.   A POS  Pain Location: Mid abd discomfort Severity: 0/10 on pain scale Associated signs and symptoms: Neg for urinary complaints, GI complaints, vaginal itching or odor. Pos for vaginal discharge.   Past Medical History:  Diagnosis Date  . Medical history non-contributory    OB History  Gravida Para Term Preterm AB Living  3 1 1   1 1   SAB TAB Ectopic Multiple Live Births  1     0 1    # Outcome Date GA Lbr Len/2nd Weight Sex Delivery Anes PTL Lv  3 Current           2 Term 02/17/17 [redacted]w[redacted]d 16:18 / 00:32 2860 g M Vag-Spont EPI  LIV  1 SAB            Past Surgical History:  Procedure Laterality Date  . NO PAST SURGERIES     Social History   Socioeconomic History  . Marital status: Married    Spouse name: Not on file  . Number of children: Not on file  . Years of education: Not on file  . Highest education level: Not on file  Occupational History  . Not on file  Tobacco Use  . Smoking status: Never Smoker  . Smokeless tobacco: Never Used  Substance and Sexual Activity  . Alcohol use: No  . Drug use: No  . Sexual activity: Not Currently    Birth control/protection: None  Other Topics Concern  . Not on file  Social History Narrative  . Not on file   Social Determinants of Health   Financial Resource Strain:   . Difficulty of Paying Living Expenses: Not on file  Food Insecurity:   . Worried About [redacted]w[redacted]d in the Last Year: Not on file  . Ran Out of Food in the Last Year: Not on file   Transportation Needs:   . Lack of Transportation (Medical): Not on file  . Lack of Transportation (Non-Medical): Not on file  Physical Activity:   . Days of Exercise per Week: Not on file  . Minutes of Exercise per Session: Not on file  Stress:   . Feeling of Stress : Not on file  Social Connections:   . Frequency of Communication with Friends and Family: Not on file  . Frequency of Social Gatherings with Friends and Family: Not on file  . Attends Religious Services: Not on file  . Active Member of Clubs or Organizations: Not on file  . Attends Programme researcher, broadcasting/film/video Meetings: Not on file  . Marital Status: Not on file  Intimate Partner Violence:   . Fear of Current or Ex-Partner: Not on file  . Emotionally Abused: Not on file  . Physically Abused: Not on file  . Sexually Abused: Not on file   No current facility-administered medications on file prior to encounter.   Current Outpatient Medications on File Prior to Encounter  Medication Sig Dispense Refill  . ondansetron (ZOFRAN) 4 MG tablet Take 1-2 tablets (4-8 mg total) by mouth every  8 (eight) hours as needed for nausea or vomiting. 12 tablet 0   No Known Allergies  I have reviewed the past Medical Hx, Surgical Hx, Social Hx, Allergies and Medications.   Review of Systems  Constitutional: Negative for chills and fever.  Gastrointestinal: Positive for abdominal pain. Negative for blood in stool, constipation, diarrhea, nausea and vomiting.  Genitourinary: Positive for vaginal bleeding and vaginal discharge. Negative for dysuria, flank pain, frequency, hematuria, pelvic pain and urgency.  Neurological: Negative for dizziness.    OBJECTIVE Patient Vitals for the past 24 hrs:  BP Temp Temp src Pulse Resp SpO2 Weight  08/18/20 0909 114/75 98.5 F (36.9 C) Oral 89 16 100 % 66 kg   Constitutional: Well-developed, well-nourished female in no acute distress.  Cardiovascular: normal rate Respiratory: normal rate and effort.   GI: Abd soft, non-tender.  MS: Extremities nontender, no edema, normal ROM Neurologic: Alert and oriented x 4.  GU:  SPECULUM EXAM: NEFG, physiologic discharge, moderate amount of dark red bloody mucus noted, cervix w/ ectropion and mild friability noted. Visually closed. No purulent discharge.   BIMANUAL: cervix Closed; uterus top- normal size, no adnexal tenderness or masses. No CMT.  LAB RESULTS Results for orders placed or performed during the hospital encounter of 08/18/20 (from the past 24 hour(s))  Pregnancy, urine POC     Status: Abnormal   Collection Time: 08/18/20  9:02 AM  Result Value Ref Range   Preg Test, Ur POSITIVE (A) NEGATIVE  Urinalysis, Routine w reflex microscopic Urine, Clean Catch     Status: Abnormal   Collection Time: 08/18/20  9:26 AM  Result Value Ref Range   Color, Urine YELLOW YELLOW   APPearance CLEAR CLEAR   Specific Gravity, Urine 1.016 1.005 - 1.030   pH 7.0 5.0 - 8.0   Glucose, UA NEGATIVE NEGATIVE mg/dL   Hgb urine dipstick MODERATE (A) NEGATIVE   Bilirubin Urine NEGATIVE NEGATIVE   Ketones, ur NEGATIVE NEGATIVE mg/dL   Protein, ur NEGATIVE NEGATIVE mg/dL   Nitrite NEGATIVE NEGATIVE   Leukocytes,Ua SMALL (A) NEGATIVE   RBC / HPF 0-5 0 - 5 RBC/hpf   WBC, UA 0-5 0 - 5 WBC/hpf   Bacteria, UA RARE (A) NONE SEEN   Squamous Epithelial / LPF 0-5 0 - 5   Mucus PRESENT   Wet prep, genital     Status: Abnormal   Collection Time: 08/18/20 10:22 AM  Result Value Ref Range   Yeast Wet Prep HPF POC NONE SEEN NONE SEEN   Trich, Wet Prep NONE SEEN NONE SEEN   Clue Cells Wet Prep HPF POC NONE SEEN NONE SEEN   WBC, Wet Prep HPF POC MANY (A) NONE SEEN   Sperm NONE SEEN   hCG, quantitative, pregnancy     Status: Abnormal   Collection Time: 08/18/20 10:34 AM  Result Value Ref Range   hCG, Beta Chain, Quant, S 243 (H) <5 mIU/mL  CBC     Status: None   Collection Time: 08/18/20 10:34 AM  Result Value Ref Range   WBC 6.3 4.0 - 10.5 K/uL   RBC 4.41 3.87 -  5.11 MIL/uL   Hemoglobin 13.4 12.0 - 15.0 g/dL   HCT 64.3 36 - 46 %   MCV 89.6 80.0 - 100.0 fL   MCH 30.4 26.0 - 34.0 pg   MCHC 33.9 30.0 - 36.0 g/dL   RDW 32.9 51.8 - 84.1 %   Platelets 226 150 - 400 K/uL   nRBC 0.0 0.0 -  0.2 %    IMAGING US OB LESS THAN 14 WEEKS WITH OB TRANSVAGINAL  Result Date: 08/18/2020 CLINICAL DATA:  Pregnant, vaginal bleeding/clots, pain EXAM: OBSTETRIC <14 WK [place] AND TRANSVAGINAL OB US TECHNIQUE: Both transabdominal and transvaginal ultrasound examinations were performed for complete evaluation of the gestation as well as the maternal uterus, adnexal regions, and pelvic cul-de-sac. Transvaginal technique was performed to assess early pregnancy. COMPARISON:  None. FINDINGS: Intrauterine gestational sac: None Yolk sac:  Not Visualized. Embryo:  Not Visualized. Maternal uterus/adnexae: Left ovary is within normal limits. Right ovary is notable for a 5.0 x 3.6 x 4.6 cm cyst with 10 mm daughter cyst and mild peripheral debris/nodularity. No free fluid. IMPRESSION: No IUP is visualized. By definition, in the setting of a positive pregnancy test, this reflects a pregnancy of unknown location. Differential considerations include early normal IUP, abnormal IUP/missed abortion, or nonvisualized ectopic pregnancy. Serial beta HCG is suggested. Consider repeat pelvic ultrasound in 14 days, as clinically warranted. Electronically Signed   By: Charline Bills M.D.   On: 08/18/2020 11:39    MAU COURSE CBC, Quant, ABO/Rh, ultrasound, wet prep and GC/chlamydia culture, UA  MDM - Vaginal bleeding in early pregnancy with pregnancy of unknown anatomic location, but hemodynamically stable. Repeat Quant in 48 hours  - Hgb and Leuks on UA, but micro Nml and pt asymptomatic. Suspect contaminated specimen. Will send urine culture and Tx accordingly.   ASSESSMENT 1. Pregnancy of unknown anatomic location   2. Vaginal bleeding in pregnancy, first trimester   3. Abdominal pain during  pregnancy in first trimester   4. Right ovarian cyst     PLAN Discharge home in stable condition. Ectopic and SAB precautions  Follow-up Information    [facility], [facility] Follow up in 2 day(s).   Why: for repeat hCG blood work Contact information: [address] [place] [address] [phone]        [facility] Follow up.   Specialty: Obstetrics and Gynecology Why: as needed for pain or heavy bleeding.  Contact information: [address] [id number] [place] [address] [phone]             Allergies as of 08/18/2020   No Known Allergies     Medication List    TAKE these medications   ondansetron 4 MG tablet Commonly known as: ZOFRAN Take 1-2 tablets (4-8 mg total) by mouth every 8 (eight) hours as needed for nausea or vomiting.        Katrinka Blazing, [place], CNM 08/18/2020  12:07 PM  4

## 2020-08-18 NOTE — Discharge Instructions (Signed)
Vaginal Bleeding During Pregnancy, First Trimester  A small amount of bleeding from the vagina (spotting) is relatively common during early pregnancy. It usually stops on its own. Various things may cause bleeding or spotting during early pregnancy. Some bleeding may be related to the pregnancy, and some may not. In many cases, the bleeding is normal and is not a problem. However, bleeding can also be a sign of something serious. Be sure to tell your health care provider about any vaginal bleeding right away. Some possible causes of vaginal bleeding during the first trimester include:  Infection or inflammation of the cervix.  Growths (polyps) on the cervix.  Miscarriage or threatened miscarriage.  Pregnancy tissue developing outside of the uterus (ectopic pregnancy).  A mass of tissue developing in the uterus due to an egg being fertilized incorrectly (molar pregnancy). Follow these instructions at home: Activity  Follow instructions from your health care provider about limiting your activity. Ask what activities are safe for you.  If needed, make plans for someone to help with your regular activities.  Do not have sex or orgasms until your health care provider says that this is safe. General instructions  Take over-the-counter and prescription medicines only as told by your health care provider.  Pay attention to any changes in your symptoms.  Do not use tampons or douche.  Write down how many pads you use each day, how often you change pads, and how soaked (saturated) they are.  If you pass any tissue from your vagina, save the tissue so you can show it to your health care provider.  Keep all follow-up visits as told by your health care provider. This is important. Contact a health care provider if:  You have vaginal bleeding during any part of your pregnancy.  You have cramps or labor pains.  You have a fever. Get help right away if:  You have severe cramps in your  back or abdomen.  You pass large clots or a large amount of tissue from your vagina.  Your bleeding increases.  You feel light-headed or weak, or you faint.  You have chills.  You are leaking fluid or have a gush of fluid from your vagina. Summary  A small amount of bleeding (spotting) from the vagina is relatively common during early pregnancy.  Various things may cause bleeding or spotting in early pregnancy.  Be sure to tell your health care provider about any vaginal bleeding right away. This information is not intended to replace advice given to you by your health care provider. Make sure you discuss any questions you have with your health care provider. Document Revised: 02/07/2019 Document Reviewed: 01/21/2017 Elsevier Patient Education  2020 Elsevier Inc.   Ectopic Pregnancy  An ectopic pregnancy is when the fertilized egg attaches (implants) outside the uterus. Most ectopic pregnancies occur in one of the tubes where eggs travel from the ovary to the uterus (fallopian tubes), but the implanting can occur in other locations. In rare cases, ectopic pregnancies occur on the ovary, intestine, pelvis, abdomen, or cervix. In an ectopic pregnancy, the fertilized egg does not have the ability to develop into a normal, healthy baby. A ruptured ectopic pregnancy is one in which tearing or bursting of a fallopian tube causes internal bleeding. Often, there is intense lower abdominal pain, and vaginal bleeding sometimes occurs. Having an ectopic pregnancy can be life-threatening. If this dangerous condition is not treated, it can lead to blood loss, shock, or even death. What are the causes?  The most common cause of this condition is damage to one of the fallopian tubes. A fallopian tube may be narrowed or blocked, and that keeps the fertilized egg from reaching the uterus. What increases the risk? This condition is more likely to develop in women of childbearing age who have different  levels of risk. The levels of risk can be divided into three categories. High risk  You have gone through infertility treatment.  You have had an ectopic pregnancy before.  You have had surgery on the fallopian tubes, or another surgical procedure, such as an abortion.  You have had surgery to have the fallopian tubes tied (tubal ligation).  You have problems or diseases of the fallopian tubes.  You have been exposed to diethylstilbestrol (DES). This medicine was used until 1971, and it had effects on babies whose mothers took the medicine.  You become pregnant while using an IUD (intrauterine device) for birth control. Moderate risk  You have a history of infertility.  You have had an STI (sexually transmitted infection).  You have a history of pelvic inflammatory disease (PID).  You have scarring from endometriosis.  You have multiple sexual partners.  You smoke. Low risk  You have had pelvic surgery.  You use vaginal douches.  You became sexually active before age 45. What are the signs or symptoms? Common symptoms of this condition include normal pregnancy symptoms, such as missing a period, nausea, tiredness, abdominal pain, breast tenderness, and bleeding. However, ectopic pregnancy will have additional symptoms, such as:  Pain with intercourse.  Irregular vaginal bleeding or spotting.  Cramping or pain on one side or in the lower abdomen.  Fast heartbeat, low blood pressure, and sweating.  Passing out while having a bowel movement. Symptoms of a ruptured ectopic pregnancy and internal bleeding may include:  Sudden, severe pain in the abdomen and pelvis.  Dizziness, weakness, light-headedness, or fainting.  Pain in the shoulder or neck area. How is this diagnosed? This condition is diagnosed by:  A pelvic exam to locate pain or a mass in the abdomen.  A pregnancy test. This blood test checks for the presence as well as the specific level of pregnancy  hormone in the bloodstream.  Ultrasound. This is performed if a pregnancy test is positive. In this test, a probe is inserted into the vagina. The probe will detect a fetus, possibly in a location other than the uterus.  Taking a sample of uterus tissue (dilation and curettage, or D&C).  Surgery to perform a visual exam of the inside of the abdomen using a thin, lighted tube that has a tiny camera on the end (laparoscope).  Culdocentesis. This procedure involves inserting a needle at the top of the vagina, behind the uterus. If blood is present in this area, it may indicate that a fallopian tube is torn. How is this treated? This condition is treated with medicine or surgery. Medicine  An injection of a medicine (methotrexate) may be given to cause the pregnancy tissue to be absorbed. This medicine may save your fallopian tube. It may be given if: ? The diagnosis is made early, with no signs of active bleeding. ? The fallopian tube has not ruptured. ? You are considered to be a good candidate for the medicine. Usually, pregnancy hormone blood levels are checked after methotrexate treatment. This is to be sure that the medicine is effective. It may take 4-6 weeks for the pregnancy to be absorbed. Most pregnancies will be absorbed by 3 weeks.  weeks. Surgery  A laparoscope may be used to remove the pregnancy tissue.  If severe internal bleeding occurs, a larger cut (incision) may be made in the lower abdomen (laparotomy) to remove the fetus and placenta. This is done to stop the bleeding.  Part or all of the fallopian tube may be removed (salpingectomy) along with the fetus and placenta. The fallopian tube may also be repaired during the surgery.  In very rare circumstances, removal of the uterus (hysterectomy) may be required.  After surgery, pregnancy hormone testing may be done to be sure that there is no pregnancy tissue left. Whether your treatment is medicine or surgery, you may receive a  Rho (D) immune globulin shot to prevent problems with any future pregnancy. This shot may be given if:  You are Rh-negative and the baby's father is Rh-positive.  You are Rh-negative and you do not know the Rh type of the baby's father. Follow these instructions at home:  Rest and limit your activity after the procedure for as long as told by your health care provider.  Until your health care provider says that it is safe: ? Do not lift anything that is heavier than 10 lb (4.5 kg), or the limit that your health care provider tells you. ? Avoid physical exercise and any movement that requires effort (is strenuous).  To help prevent constipation: ? Eat a healthy diet that includes fruits, vegetables, and whole grains. ? Drink 6-8 glasses of water per day. Get help right away if:  You develop worsening pain that is not relieved by medicine.  You have: ? A fever or chills. ? Vaginal bleeding. ? Redness and swelling at the incision site. ? Nausea and vomiting.  You feel dizzy or weak.  You feel light-headed or you faint. This information is not intended to replace advice given to you by your health care provider. Make sure you discuss any questions you have with your health care provider. Document Revised: 10/01/2017 Document Reviewed: 05/20/2016 Elsevier Patient Education  2020 Elsevier Inc.  

## 2020-08-18 NOTE — MAU Note (Signed)
Maria Morales is a 29 y.o. at Unknown here in MAU reporting:  A concern for possible miscarriage  +vaginal bleeding Notices when she wiped. Light pink +clots  +lower abdominal "mild" cramping Intermittently. None at present time.  LMP: 07/13/20 Onset of complaint: yesterday Pain score: 0/10: "mild"  Vitals:   08/18/20 0909  BP: 114/75  Pulse: 89  Resp: 16  Temp: 98.5 F (36.9 C)  SpO2: 100%     Lab orders placed from triage: ua and pregnancy test  Plans to see [facility].

## 2020-08-19 ENCOUNTER — Inpatient Hospital Stay (HOSPITAL_COMMUNITY)
Admission: AD | Admit: 2020-08-19 | Discharge: 2020-08-19 | Disposition: A | Discharge status: Home / Self Care | Payer: Commercial Managed Care - PPO | Attending: Obstetrics & Gynecology | Admitting: Obstetrics & Gynecology

## 2020-08-19 ENCOUNTER — Other Ambulatory Visit: Payer: Self-pay

## 2020-08-19 ENCOUNTER — Encounter (HOSPITAL_COMMUNITY): Payer: Self-pay | Admitting: Obstetrics & Gynecology

## 2020-08-19 DIAGNOSIS — O039 Complete or unspecified spontaneous abortion without complication: Secondary | ICD-10-CM

## 2020-08-19 LAB — CBC
HCT: 39.3 % (ref 36.0–46.0)
Hemoglobin: 13.4 g/dL (ref 12.0–15.0)
MCH: 31.1 pg (ref 26.0–34.0)
MCHC: 34.1 g/dL (ref 30.0–36.0)
MCV: 91.2 fL (ref 80.0–100.0)
Platelets: 229 10*3/uL (ref 150–400)
RBC: 4.31 MIL/uL (ref 3.87–5.11)
RDW: 12.2 % (ref 11.5–15.5)
WBC: 6.4 10*3/uL (ref 4.0–10.5)
nRBC: 0 % (ref 0.0–0.2)

## 2020-08-19 LAB — COMPREHENSIVE METABOLIC PANEL
ALT: 35 U/L (ref 0–44)
AST: 25 U/L (ref 15–41)
Albumin: 3.9 g/dL (ref 3.5–5.0)
Alkaline Phosphatase: 43 U/L (ref 38–126)
Anion gap: 9 (ref 5–15)
BUN: 6 mg/dL (ref 6–20)
CO2: 23 mmol/L (ref 22–32)
Calcium: 9.1 mg/dL (ref 8.9–10.3)
Chloride: 107 mmol/L (ref 98–111)
Creatinine, Ser: 0.62 mg/dL (ref 0.44–1.00)
GFR, Estimated: >60 mL/min (ref >60)
Glucose, Bld: 96 mg/dL (ref 70–99)
Potassium: 3.8 mmol/L (ref 3.5–5.1)
Sodium: 139 mmol/L (ref 135–145)
Total Bilirubin: 0.9 mg/dL (ref 0.3–1.2)
Total Protein: 6.9 g/dL (ref 6.5–8.1)

## 2020-08-19 LAB — GC/CHLAMYDIA PROBE AMP (~~LOC~~) NOT AT ARMC
Chlamydia: NEGATIVE
Comment: NEGATIVE
Comment: NORMAL
Neisseria Gonorrhea: NEGATIVE

## 2020-08-19 LAB — HCG, QUANTITATIVE, PREGNANCY: hCG, Beta Chain, Quant, S: 96 m[IU]/mL — ABNORMAL HIGH (ref <5)

## 2020-08-19 NOTE — MAU Provider Note (Signed)
Patient Maria Morales is a 29 y.o. G3P1011  at [redacted]w[redacted]d here with complaints of vaginal bleeding and pain that got worse from her visit to MAU yesterday. She was diagnosed with pregnancy of unknown location yesterdday, was given ectopic precautions and advice to come back with worsening pain or bleeding. Her follow-up bHCG is scheduled for tomorrow at [facility].   She is a patient at [facility].  History     CSN: [id number]  Arrival date and time: 08/19/20 1051   First Provider Initiated Contact with Patient 08/19/20 1236      Chief Complaint  Patient presents with  . Vaginal Bleeding   Vaginal Bleeding The patient's primary symptoms include vaginal bleeding. This is a new problem. The current episode started in the past 7 days. The problem has been gradually improving. Associated symptoms include abdominal pain.  She denies abdominal pain, she had some cramping this morning which was a 7/10. She did not take anything for the pain. The pain was improved by lying down, but when she moved around it got worse.   OB History    Gravida  3   Para  1   Term  1   Preterm      AB  1   Living  1     SAB  1   TAB      Ectopic      Multiple  0   Live Births  1           Past Medical History:  Diagnosis Date  . Medical history non-contributory     Past Surgical History:  Procedure Laterality Date  . NO PAST SURGERIES      Family History  Problem Relation Age of Onset  . Hypertension Father     Social History   Tobacco Use  . Smoking status: Never Smoker  . Smokeless tobacco: Never Used  Substance Use Topics  . Alcohol use: No  . Drug use: No    Allergies: No Known Allergies  Medications Prior to Admission  Medication Sig Dispense Refill Last Dose  . ondansetron (ZOFRAN) 4 MG tablet Take 1-2 tablets (4-8 mg total) by mouth every 8 (eight) hours as needed for nausea or vomiting. 12 tablet 0     Review of Systems  Constitutional: Negative.    HENT: Negative.   Respiratory: Negative.   Cardiovascular: Negative.   Gastrointestinal: Positive for abdominal pain.  Genitourinary: Positive for vaginal bleeding.  Musculoskeletal: Negative.   Neurological: Negative.   Hematological: Negative.   Psychiatric/Behavioral: Negative.    Physical Exam   Blood pressure 126/79, pulse 92, temperature 98.4 F (36.9 C), resp. rate 18, height 5\' 4"  (1.626 m), weight 66 kg, last menstrual period 07/13/2020, SpO2 100 %, unknown if currently breastfeeding.  Physical Exam HENT:     Head: Normocephalic.     Nose: Nose normal.  Abdominal:     General: Abdomen is flat.  Genitourinary:    General: Normal vulva.     Vagina: Vaginal discharge present.  Musculoskeletal:        General: Normal range of motion.  Skin:    General: Skin is warm.  Neurological:     Mental Status: She is alert.     MAU Course  Procedures  MDM-will repeat bHCG.  -will draw CBC, CMP -reviewed 09/12/2020 results from yesterday, which showed empty uterus. Beta was 243 yesterday -beta today was 96, confirmed miscarriage in pregnancy Assessment and Plan   1. Miscarriage  2. Patient informed of results, she will call [facility] and tell them about her results and schedule follow-up.  3. Reviewed bleeding precautions and when to return to MAU.  4. Patient verbalized understanding; all questions answered.  Charlesetta Garibaldi Maria Morales 08/19/2020, 12:44 PM

## 2020-08-19 NOTE — Discharge Instructions (Signed)
Miscarriage A miscarriage is the loss of an unborn baby (fetus) before the 20th week of pregnancy. Follow these instructions at home: Medicines   Take over-the-counter and prescription medicines only as told by your doctor.  If you were prescribed antibiotic medicine, take it as told by your doctor. Do not stop taking the antibiotic even if you start to feel better.  Do not take NSAIDs unless your doctor says that this is safe for you. NSAIDs include aspirin and ibuprofen. These medicines can cause bleeding. Activity  Rest as directed. Ask your doctor what activities are safe for you.  Have someone help you at home during this time. General instructions  Write down how many pads you use each day and how soaked they are.  Watch the amount of tissue or clumps of blood (blood clots) that you pass from your vagina. Save any large amounts of tissue for your doctor.  Do not use tampons, douche, or have sex until your doctor approves.  To help you and your partner with the process of grieving, talk with your doctor or seek counseling.  When you are ready, meet with your doctor to talk about steps you should take for your health. Also, talk with your doctor about steps to take to have a healthy pregnancy in the future.  Keep all follow-up visits as told by your doctor. This is important. Contact a doctor if:  You have a fever or chills.  You have vaginal discharge that smells bad.  You have more bleeding. Get help right away if:  You have very bad cramps or pain in your back or belly.  You pass clumps of blood that are walnut-sized or larger from your vagina.  You pass tissue that is walnut-sized or larger from your vagina.  You soak more than 1 regular pad in an hour.  You get light-headed or weak.  You faint (pass out).  You have feelings of sadness that do not go away, or you have thoughts of hurting yourself. Summary  A miscarriage is the loss of an unborn baby before  the 20th week of pregnancy.  Follow your doctor's instructions for home care. Keep all follow-up appointments.  To help you and your partner with the process of grieving, talk with your doctor or seek counseling. This information is not intended to replace advice given to you by your health care provider. Make sure you discuss any questions you have with your health care provider. Document Revised: 02/10/2019 Document Reviewed: 11/24/2016 Elsevier Patient Education  2020 Elsevier Inc.  

## 2020-08-19 NOTE — MAU Note (Signed)
.   Maria Morales is a 29 y.o. at [redacted]w[redacted]d here in MAU reporting: she was evaluated in MAU yesterday for vaginal bleeding and cramping and was told to come back in if it got worse. States vaginal bleeding and now states she has to use a pad LMP: 07/13/2020 Onset of complaint: yesterday Pain score: 0 at present comes and goes Vitals:   08/19/20 1127  BP: 126/79  Pulse: 92  Resp: 18  Temp: 98.4 F (36.9 C)  SpO2: 100%     FHT: Lab orders placed from triage:

## 2021-05-14 ENCOUNTER — Emergency Department (HOSPITAL_COMMUNITY)
Admission: EM | Admit: 2021-05-14 | Discharge: 2021-05-14 | Discharge status: Against Medical Advice | Payer: BC Managed Care – PPO

## 2021-05-14 NOTE — ED Notes (Signed)
Called for triage, no response.

## 2021-05-14 NOTE — ED Notes (Signed)
Called for triage x3, no response. 

## 2021-11-25 IMAGING — US US OB < 14 WEEKS - US OB TV
1 series · 15 of 28 positions shown · non-contrast
Comparison: None.

CLINICAL DATA: Pregnant, vaginal bleeding/clots, pain

EXAM:
OBSTETRIC <14 WK US AND TRANSVAGINAL OB US
TECHNIQUE: Both transabdominal and transvaginal ultrasound examinations were
performed for complete evaluation of the gestation as well as the
maternal uterus, adnexal regions, and pelvic cul-de-sac.
Transvaginal technique was performed to assess early pregnancy.

[Series 1: us ob < 14 weeks - us ob tv · 15 of 45 slices shown]
[im 1/45]
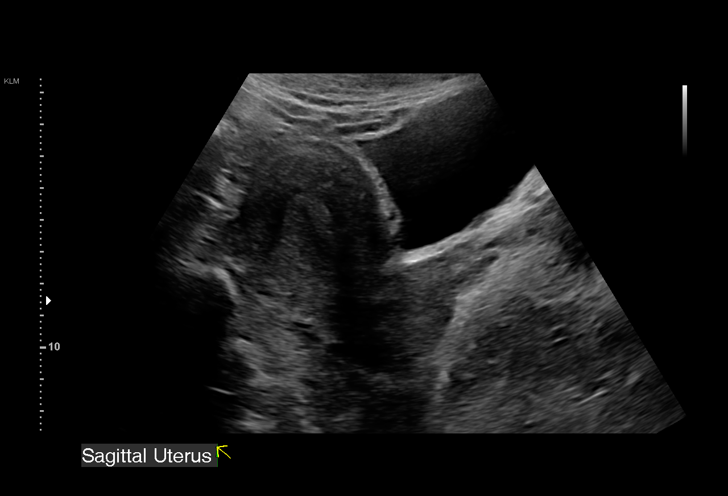
[im 4/45]
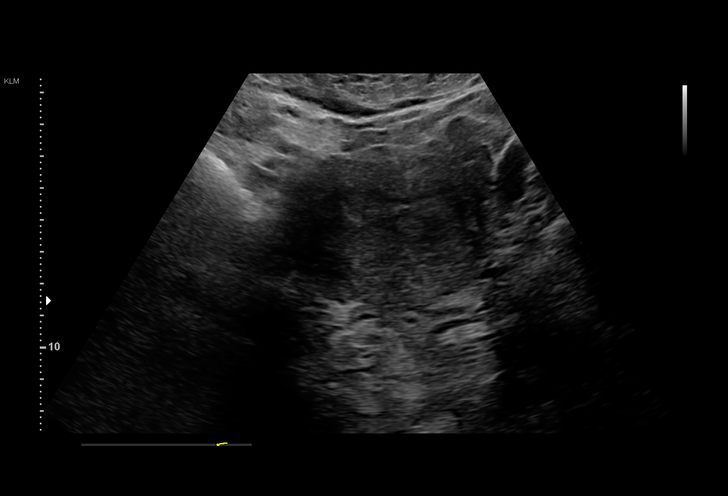
[im 7/45]
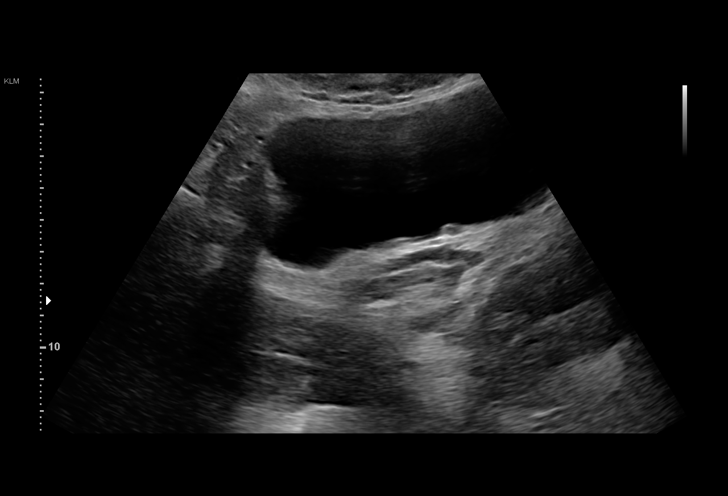
[im 10/45]
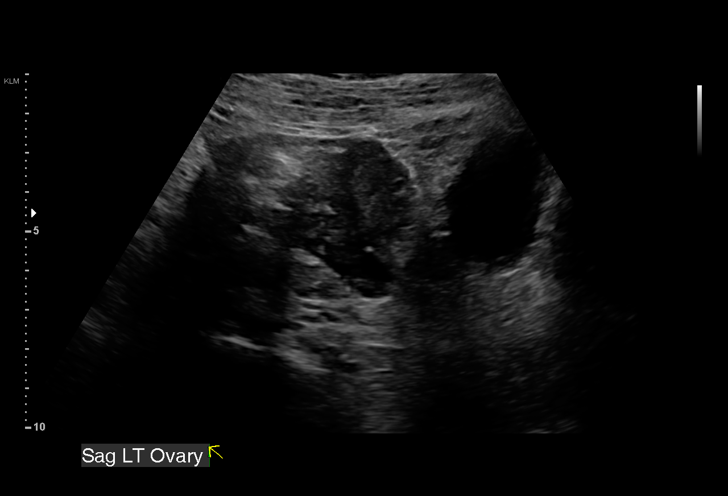
[im 14/45]
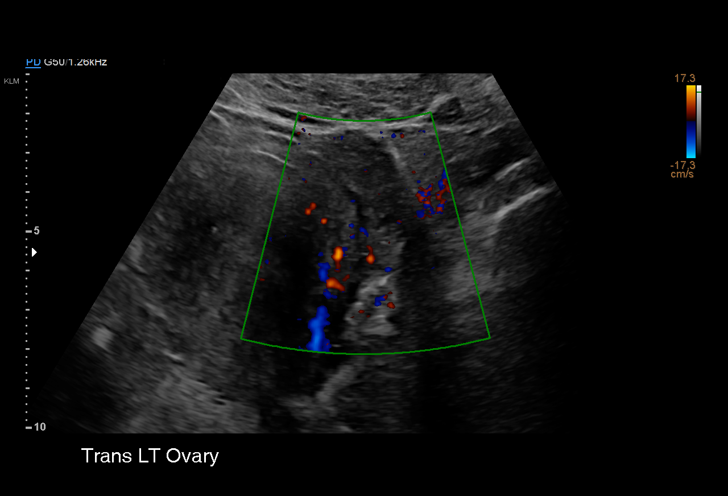
[im 17/45]
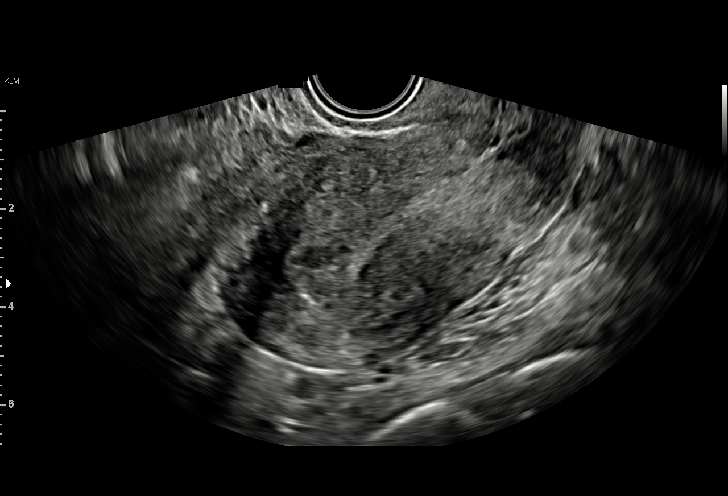
[im 20/45]
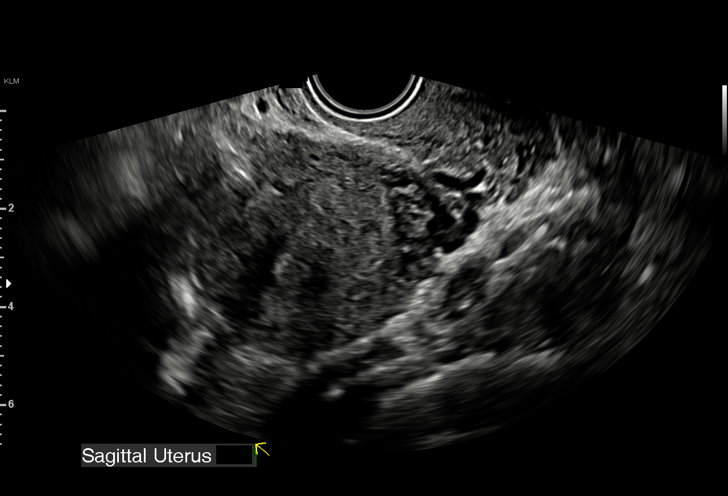
[im 23/45]
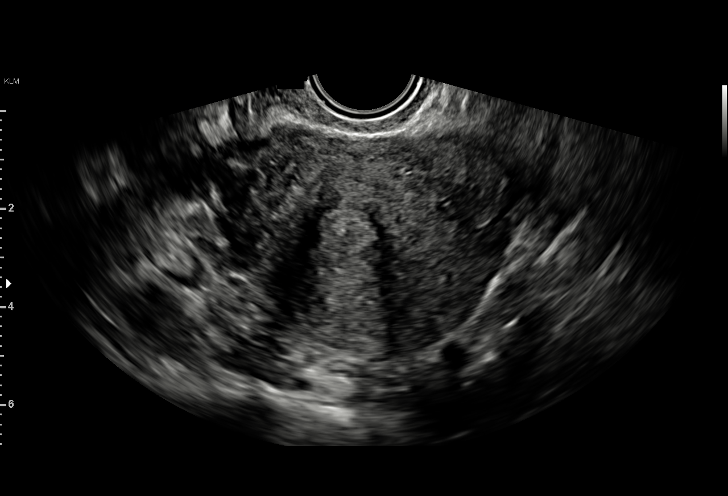
[im 25/45]
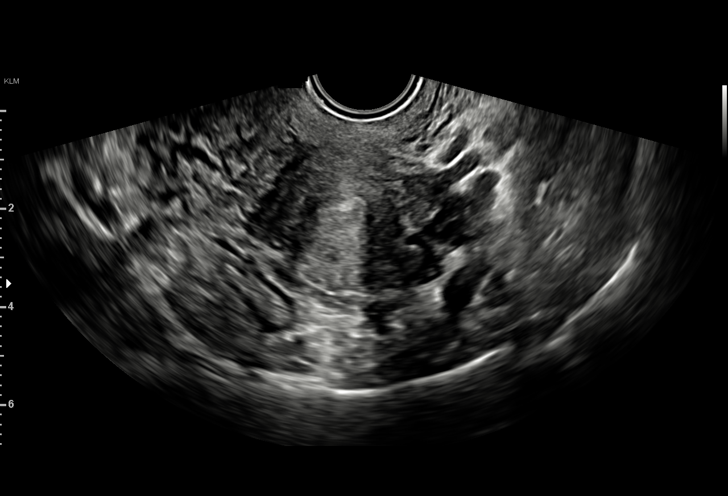
[im 28/45]
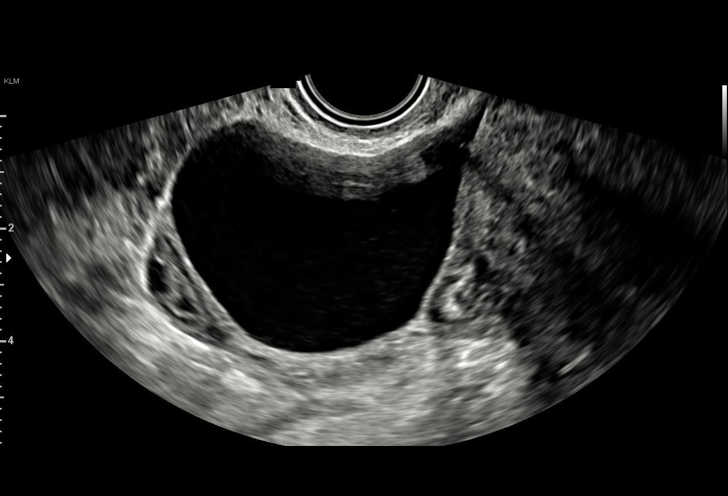
[im 31/45]
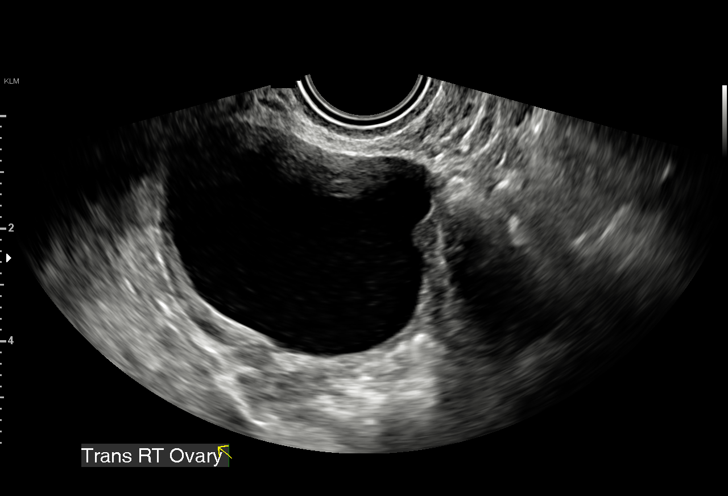
[im 35/45]
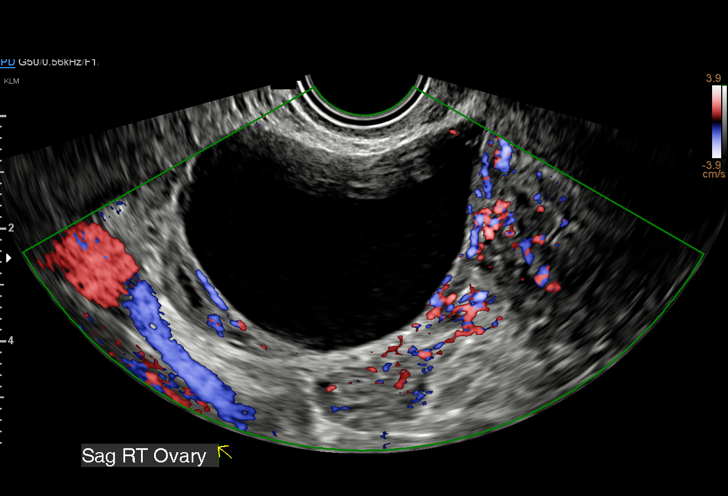
[im 38/45]
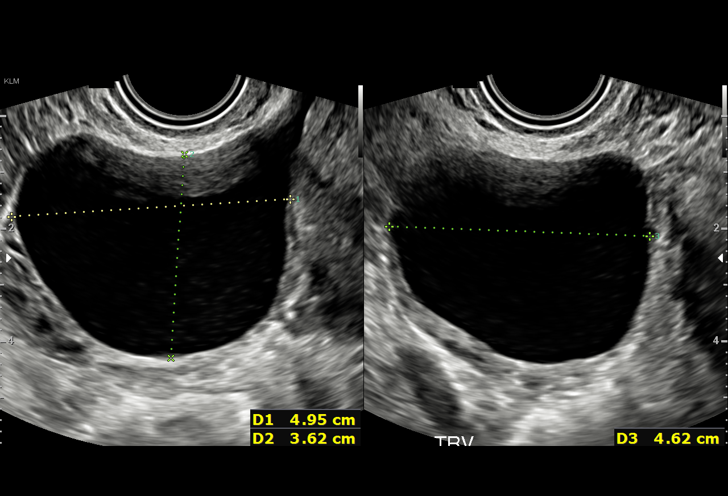
[im 41/45]
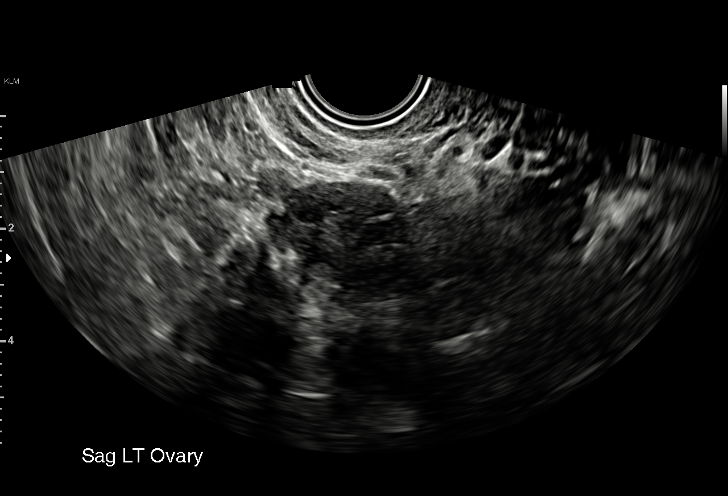
[im 45/45]
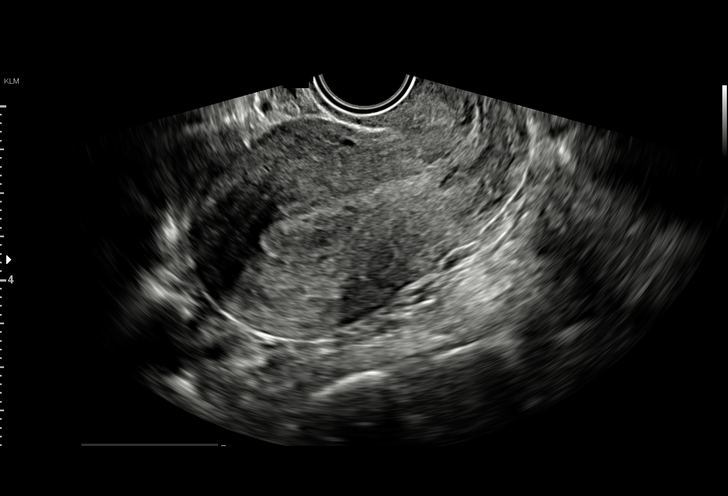

[15 of 28 positions shown; findings below may reference images not displayed]

FINDINGS: Intrauterine gestational sac: None

Yolk sac:  Not Visualized.

Embryo:  Not Visualized.

Maternal uterus/adnexae: Left ovary is within normal limits.

Right ovary is notable for a 5.0 x 3.6 x 4.6 cm cyst with 10 mm
daughter cyst and mild peripheral debris/nodularity.

No free fluid.
IMPRESSION: No IUP is visualized.

By definition, in the setting of a positive pregnancy test, this
reflects a pregnancy of unknown location. Differential
considerations include early normal IUP, abnormal IUP/missed
abortion, or nonvisualized ectopic pregnancy.

Serial beta HCG is suggested. Consider repeat pelvic ultrasound in
14 days, as clinically warranted.

## 2022-01-17 ENCOUNTER — Encounter (HOSPITAL_COMMUNITY): Payer: Self-pay

## 2022-01-17 ENCOUNTER — Other Ambulatory Visit: Payer: Self-pay

## 2022-01-17 ENCOUNTER — Emergency Department (HOSPITAL_COMMUNITY)
Admission: EM | Admit: 2022-01-17 | Discharge: 2022-01-18 | Disposition: A | Discharge status: Home / Self Care | Payer: BC Managed Care – PPO | Attending: Emergency Medicine | Admitting: Emergency Medicine

## 2022-01-17 DIAGNOSIS — R000 Tachycardia, unspecified: Secondary | ICD-10-CM | POA: Diagnosis present

## 2022-01-17 DIAGNOSIS — R791 Abnormal coagulation profile: Secondary | ICD-10-CM | POA: Insufficient documentation

## 2022-01-17 DIAGNOSIS — N390 Urinary tract infection, site not specified: Secondary | ICD-10-CM | POA: Diagnosis not present

## 2022-01-17 LAB — CBC WITH DIFFERENTIAL/PLATELET
Abs Immature Granulocytes: 0.02 10*3/uL (ref 0.00–0.07)
Basophils Absolute: 0.1 10*3/uL (ref 0.0–0.1)
Basophils Relative: 1 %
Eosinophils Absolute: 0 10*3/uL (ref 0.0–0.5)
Eosinophils Relative: 0 %
HCT: 38.3 % (ref 36.0–46.0)
Hemoglobin: 13.1 g/dL (ref 12.0–15.0)
Immature Granulocytes: 0 %
Lymphocytes Relative: 34 %
Lymphs Abs: 2.5 10*3/uL (ref 0.7–4.0)
MCH: 30.6 pg (ref 26.0–34.0)
MCHC: 34.2 g/dL (ref 30.0–36.0)
MCV: 89.5 fL (ref 80.0–100.0)
Monocytes Absolute: 0.7 10*3/uL (ref 0.1–1.0)
Monocytes Relative: 9 %
Neutro Abs: 4.2 10*3/uL (ref 1.7–7.7)
Neutrophils Relative %: 56 %
Platelets: 251 10*3/uL (ref 150–400)
RBC: 4.28 MIL/uL (ref 3.87–5.11)
RDW: 12 % (ref 11.5–15.5)
WBC: 7.5 10*3/uL (ref 4.0–10.5)
nRBC: 0 % (ref 0.0–0.2)

## 2022-01-17 LAB — I-STAT BETA HCG BLOOD, ED (MC, WL, AP ONLY): I-stat hCG, quantitative: <5 m[IU]/mL (ref <5)

## 2022-01-17 LAB — COMPREHENSIVE METABOLIC PANEL
ALT: 25 U/L (ref 0–44)
AST: 26 U/L (ref 15–41)
Albumin: 3.8 g/dL (ref 3.5–5.0)
Alkaline Phosphatase: 54 U/L (ref 38–126)
Anion gap: 8 (ref 5–15)
BUN: 8 mg/dL (ref 6–20)
CO2: 24 mmol/L (ref 22–32)
Calcium: 9.2 mg/dL (ref 8.9–10.3)
Chloride: 105 mmol/L (ref 98–111)
Creatinine, Ser: 0.6 mg/dL (ref 0.44–1.00)
GFR, Estimated: >60 mL/min (ref >60)
Glucose, Bld: 97 mg/dL (ref 70–99)
Potassium: 3.7 mmol/L (ref 3.5–5.1)
Sodium: 137 mmol/L (ref 135–145)
Total Bilirubin: 0.3 mg/dL (ref 0.3–1.2)
Total Protein: 6.6 g/dL (ref 6.5–8.1)

## 2022-01-17 LAB — URINALYSIS, ROUTINE W REFLEX MICROSCOPIC
Bilirubin Urine: NEGATIVE
Glucose, UA: NEGATIVE mg/dL
Hgb urine dipstick: NEGATIVE
Ketones, ur: NEGATIVE mg/dL
Nitrite: NEGATIVE
Protein, ur: NEGATIVE mg/dL
Specific Gravity, Urine: 1.012 (ref 1.005–1.030)
pH: 7 (ref 5.0–8.0)

## 2022-01-17 NOTE — ED Provider Triage Note (Signed)
Emergency Medicine Provider Triage Evaluation Note ? ?Maria Morales , a 31 y.o. female  was evaluated in triage.  Pt complains of feelings of her heart racing.  She reports that since march second she has been having episodes when she feels like her heart is racing.  She reports that she has episodes where she gets sudden onset of feeling like her heart rate is high, and feels light headed and like she is going to pass out.   ? ?She reports that she was at [place] and was told it was pots or vertigo per her report.   ? ?She has a heart monitor on from her doctor.   ? ?She says she was at [facility] and they checked for blood work, checked for blood clots, said she had an ear infection and scanned her head.  ? ? ?Physical Exam  ?BP 127/86 (BP Location: Left Arm)   Pulse (!) 108   Temp 98.6 ?F (37 ?C) (Oral)   Resp 20   Ht 5\' 6"  (1.676 m)   Wt 68 kg   LMP 12/29/2021   SpO2 100%   BMI 24.21 kg/m?  ?Gen:   Awake, no distress   ?Resp:  Normal effort  ?MSK:   Moves extremities without difficulty  ?Other:  Normal speech ? ?Medical Decision Making  ?Medically screening exam initiated at 9:38 PM.  Appropriate orders placed.  Emery Binz was informed that the remainder of the evaluation will be completed by another provider, this initial triage assessment does not replace that evaluation, and the importance of remaining in the ED until their evaluation is complete. ? ? ?  ?Maria Journey, PA-C ?01/17/22 2149 ? ?

## 2022-01-17 NOTE — ED Triage Notes (Signed)
Pt started with an episode of tachycardia at [facility], has had similar symptoms in the past. Does not take any medication for it. ?

## 2022-01-18 ENCOUNTER — Emergency Department (HOSPITAL_COMMUNITY): Payer: BC Managed Care – PPO

## 2022-01-18 ENCOUNTER — Encounter (HOSPITAL_COMMUNITY): Payer: Self-pay | Admitting: Emergency Medicine

## 2022-01-18 LAB — D-DIMER, QUANTITATIVE: D-Dimer, Quant: 1.15 ug/mL-FEU — ABNORMAL HIGH (ref 0.00–0.50)

## 2022-01-18 MED ORDER — IOHEXOL 350 MG/ML SOLN
50.0000 mL | Freq: Once | INTRAVENOUS | Status: AC | PRN
  Administered 2022-01-18: 50 mL via INTRAVENOUS

## 2022-01-18 MED ORDER — NITROFURANTOIN MONOHYD MACRO 100 MG PO CAPS: 100.0000 mg | ORAL_CAPSULE | Freq: Two times a day (BID) | ORAL | 0 refills | Status: DC

## 2022-01-18 MED ORDER — SODIUM CHLORIDE 0.9 % IV BOLUS
500.0000 mL | Freq: Once | INTRAVENOUS | Status: AC
  Administered 2022-01-18: 500 mL via INTRAVENOUS

## 2022-01-18 MED ORDER — NITROFURANTOIN MONOHYD MACRO 100 MG PO CAPS
100.0000 mg | ORAL_CAPSULE | Freq: Once | ORAL | Status: AC
  Administered 2022-01-18: 100 mg via ORAL
  Filled 2022-01-18: qty 1

## 2022-01-18 NOTE — ED Notes (Signed)
Pt HR shot up to 150s on monitor - MD notified - bolus to be ordered  ?

## 2022-01-18 NOTE — ED Notes (Signed)
Pt called out to report that she had another "episode" that she has been having - pt reports that she felt dizzy and weak + diaphoretic - On assessment pt appears anxious and diaphoretic - pt to go to CT stat - ct called and going to come get pt  ?

## 2022-01-18 NOTE — ED Notes (Signed)
IV fluids restarted - pt provided with cold washcloth and fan at this time  ?

## 2022-01-18 NOTE — ED Notes (Signed)
Pt ambulatory to restroom

## 2022-01-18 NOTE — ED Notes (Signed)
Pt to ct 

## 2022-01-18 NOTE — ED Notes (Signed)
Patient Alert and oriented to baseline. Stable and ambulatory to baseline. Patient verbalized understanding of the discharge instructions.  Patient belongings were taken by the patient.   

## 2022-01-18 NOTE — ED Provider Notes (Signed)
?[facility] EMERGENCY DEPARTMENT ?Provider Note ? ? ?CSN: [id number] ?Arrival date & time: 01/17/22  2035 ? ?  ? ?History ? ?Chief Complaint  ?Patient presents with  ? Tachycardia  ? ? ?Prim Morace is a 31 y.o. female. ? ?The history is provided by the patient and the spouse.  ?Illness ?Location:  Body ?Quality:  Multiple episodes of tachycardia, seen by [facility] Salvia and PND with negative work up and is wearing a holter monitor. told to come to the ED if returned ?Severity:  Moderate ?Onset quality:  Gradual ?Duration: weeks. ?Timing:  Intermittent ?Progression:  Unchanged ?Chronicity:  New ?Context:  States negative thyroid work up.  wearing a holter monitor. ?Relieved by:  Nothing ?Worsened by:  Nothing ?Ineffective treatments:  None ?Associated symptoms: no abdominal pain, no chest pain, no fever, no rash, no shortness of breath, no sore throat, no vomiting and no wheezing   ?Risk factors:  None, no OCP ? ?  ? ?Home Medications ?Prior to Admission medications   ?Medication Sig Start Date End Date Taking? Authorizing Provider  ?Homeopathic Products (ZICAM COLD REMEDY NA) Place 1 spray into both nostrils 2 (two) times daily as needed (congestion).   Yes [provider]  ?loratadine (CLARITIN) 10 MG tablet Take 10 mg by mouth daily as needed for allergies.   Yes [provider]  ?amoxicillin-clavulanate (AUGMENTIN) 875-125 MG tablet Take 1 tablet by mouth See admin instructions. Bid x 10 days ?Patient not taking: Reported on 01/18/2022 01/07/22   [provider]  ?   ? ?Allergies    ?Patient has no known allergies.   ? ?Review of Systems   ?Review of Systems  ?Constitutional:  Negative for fever.  ?HENT:  Negative for facial swelling and sore throat.   ?Eyes:  Negative for redness.  ?Respiratory:  Negative for shortness of breath and wheezing.   ?Cardiovascular:  Positive for palpitations. Negative for chest pain and leg swelling.  ?Gastrointestinal:  Negative for abdominal  pain and vomiting.  ?Genitourinary:  Negative for difficulty urinating.  ?Skin:  Negative for rash.  ?Neurological:  Negative for facial asymmetry.  ?All other systems reviewed and are negative. ? ?Physical Exam ?Updated Vital Signs ?BP 101/73   Pulse 90   Temp 98.6 ?F (37 ?C) (Oral)   Resp 14   Ht 5\' 6"  (1.676 m)   Wt 68 kg   LMP 12/29/2021   SpO2 100%   BMI 24.21 kg/m?  ?Physical Exam ?Vitals and nursing note reviewed.  ?Constitutional:   ?   General: She is not in acute distress. ?   Appearance: Normal appearance.  ?HENT:  ?   Head: Normocephalic and atraumatic.  ?   Nose: Nose normal.  ?Eyes:  ?   Conjunctiva/sclera: Conjunctivae normal.  ?   Pupils: Pupils are equal, round, and reactive to light.  ?Cardiovascular:  ?   Rate and Rhythm: Normal rate and regular rhythm.  ?   Pulses: Normal pulses.  ?   Heart sounds: Normal heart sounds.  ?Pulmonary:  ?   Effort: Pulmonary effort is normal.  ?   Breath sounds: Normal breath sounds.  ?Abdominal:  ?   General: Abdomen is flat. Bowel sounds are normal.  ?   Palpations: Abdomen is soft.  ?   Tenderness: There is no abdominal tenderness. There is no guarding.  ?Musculoskeletal:     ?   General: Normal range of motion.  ?   Cervical back: Normal range of motion and neck  supple.  ?   Right lower leg: No edema.  ?   Left lower leg: No edema.  ?Skin: ?   General: Skin is warm and dry.  ?   Capillary Refill: Capillary refill takes less than 2 seconds.  ?Neurological:  ?   General: No focal deficit present.  ?   Mental Status: She is alert and oriented to person, place, and time.  ?   Deep Tendon Reflexes: Reflexes normal.  ?Psychiatric:     ?   Thought Content: Thought content normal.  ? ? ?ED Results / Procedures / Treatments   ?Labs ?(all labs ordered are listed, but only abnormal results are displayed) ?Results for orders placed or performed during the hospital encounter of 01/17/22  ?CBC with Differential  ?Result Value Ref Range  ? WBC 7.5 4.0 - 10.5 K/uL  ? RBC  4.28 3.87 - 5.11 MIL/uL  ? Hemoglobin 13.1 12.0 - 15.0 g/dL  ? HCT 38.3 36.0 - 46.0 %  ? MCV 89.5 80.0 - 100.0 fL  ? MCH 30.6 26.0 - 34.0 pg  ? MCHC 34.2 30.0 - 36.0 g/dL  ? RDW 12.0 11.5 - 15.5 %  ? Platelets 251 150 - 400 K/uL  ? nRBC 0.0 0.0 - 0.2 %  ? Neutrophils Relative % 56 %  ? Neutro Abs 4.2 1.7 - 7.7 K/uL  ? Lymphocytes Relative 34 %  ? Lymphs Abs 2.5 0.7 - 4.0 K/uL  ? Monocytes Relative 9 %  ? Monocytes Absolute 0.7 0.1 - 1.0 K/uL  ? Eosinophils Relative 0 %  ? Eosinophils Absolute 0.0 0.0 - 0.5 K/uL  ? Basophils Relative 1 %  ? Basophils Absolute 0.1 0.0 - 0.1 K/uL  ? Immature Granulocytes 0 %  ? Abs Immature Granulocytes 0.02 0.00 - 0.07 K/uL  ?Comprehensive metabolic panel  ?Result Value Ref Range  ? Sodium 137 135 - 145 mmol/L  ? Potassium 3.7 3.5 - 5.1 mmol/L  ? Chloride 105 98 - 111 mmol/L  ? CO2 24 22 - 32 mmol/L  ? Glucose, Bld 97 70 - 99 mg/dL  ? BUN 8 6 - 20 mg/dL  ? Creatinine, Ser 0.60 0.44 - 1.00 mg/dL  ? Calcium 9.2 8.9 - 10.3 mg/dL  ? Total Protein 6.6 6.5 - 8.1 g/dL  ? Albumin 3.8 3.5 - 5.0 g/dL  ? AST 26 15 - 41 U/L  ? ALT 25 0 - 44 U/L  ? Alkaline Phosphatase 54 38 - 126 U/L  ? Total Bilirubin 0.3 0.3 - 1.2 mg/dL  ? GFR, Estimated >60 >60 mL/min  ? Anion gap 8 5 - 15  ?Urinalysis, Routine w reflex microscopic Urine, Clean Catch  ?Result Value Ref Range  ? Color, Urine YELLOW YELLOW  ? APPearance HAZY (A) CLEAR  ? Specific Gravity, Urine 1.012 1.005 - 1.030  ? pH 7.0 5.0 - 8.0  ? Glucose, UA NEGATIVE NEGATIVE mg/dL  ? Hgb urine dipstick NEGATIVE NEGATIVE  ? Bilirubin Urine NEGATIVE NEGATIVE  ? Ketones, ur NEGATIVE NEGATIVE mg/dL  ? Protein, ur NEGATIVE NEGATIVE mg/dL  ? Nitrite NEGATIVE NEGATIVE  ? Leukocytes,Ua LARGE (A) NEGATIVE  ? RBC / HPF 0-5 0 - 5 RBC/hpf  ? WBC, UA 11-20 0 - 5 WBC/hpf  ? Bacteria, UA FEW (A) NONE SEEN  ? Squamous Epithelial / LPF 6-10 0 - 5  ? Mucus PRESENT   ?D-dimer, quantitative  ?Result Value Ref Range  ? D-Dimer, Quant 1.15 (H) 0.00 - 0.50 ug/mL-FEU  ?I-Stat  beta hCG blood, ED  ?Result Value Ref Range  ? I-stat hCG, quantitative <5.0 <5 mIU/mL  ? Comment 3          ? ?No results found. ? ?EKG ?EKG Interpretation ? ?Date/Time:  Saturday January 17 2022 20:55:36 EDT ?Ventricular Rate:  93 ?PR Interval:  132 ?QRS Duration: 82 ?QT Interval:  328 ?QTC Calculation: 407 ?R Axis:   89 ?Text Interpretation: Normal sinus rhythm with sinus arrhythmia Confirmed by Nicanor Alcon, Raeann Offner ([id number]) on 01/18/2022 3:07:50 AM ? ?Radiology ?No results found. ? ?Procedures ?Procedures  ? ? ?Medications Ordered in ED ?Medications  ?nitrofurantoin (macrocrystal-monohydrate) (MACROBID) capsule 100 mg (100 mg Oral Given 01/18/22 0534)  ?sodium chloride 0.9 % bolus 500 mL (500 mLs Intravenous New Bag/Given 01/18/22 0604)  ? ? ?ED Course/ Medical Decision Making/ A&P ?  ?                        ?Medical Decision Making ?Multiple episodes of increased HR, one witnessed in the Ed sinus tachycardia, has a holter monitor.   ? ?Amount and/or Complexity of Data Reviewed ?Independent Historian: spouse ?   Details: see above ?Labs: ordered. ?   Details: all labs reviewed by me, normal CBC white count is normal; normal chemistry panel, elevated ddimer, uti on urinalysis ?Radiology: ordered and independent interpretation performed. ?   Details: CTA negative by my read ?Discussion of management or test interpretation with external provider(s): Case d/w Dr. Ashley Royalty of cardiology.  Agrees with no caffeine, and following up to turn in holter monitor.  No additional recommendations at this time.   ? ?Risk ?Prescription drug management. ?Risk Details: Given a fluid bolus for an elevated HR which was clearly sinus tachycardia.  I suspect a lot of this is related to anxiety because when I went to speak with the patient about the elevated d-dimer and need for CTA patient HR went immediately from 70s on monitor to mid 120s.  It then immediately returned to normal.  I have advised no alcohol, drinking more water and avoiding  stress.   ? ? ? ?Final Clinical Impression(s) / ED Diagnoses ?Final diagnoses:  ?Urinary tract infection without hematuria, site unspecified  ? ?Return for intractable cough, coughing up blood, fevers > 100.4 un

## 2022-01-18 NOTE — Discharge Instructions (Addendum)
No caffeine, drink copious amounts of water and minimize stress ?

## 2022-02-18 ENCOUNTER — Encounter: Payer: Self-pay | Admitting: Cardiology

## 2022-03-04 ENCOUNTER — Ambulatory Visit: Payer: BC Managed Care – PPO | Admitting: Cardiology

## 2022-03-04 ENCOUNTER — Encounter: Payer: Self-pay | Admitting: Cardiology

## 2022-03-04 DIAGNOSIS — R0609 Other forms of dyspnea: Secondary | ICD-10-CM | POA: Diagnosis not present

## 2022-03-04 DIAGNOSIS — R42 Dizziness and giddiness: Secondary | ICD-10-CM | POA: Diagnosis not present

## 2022-03-04 DIAGNOSIS — R002 Palpitations: Secondary | ICD-10-CM | POA: Insufficient documentation

## 2022-03-04 NOTE — Addendum Note (Signed)
Addended by: Baldo Ash D on: 03/04/2022 03:35 PM ? ? Modules accepted: Orders ? ?

## 2022-03-04 NOTE — Patient Instructions (Signed)
Medication Instructions:  Your physician recommends that you continue on your current medications as directed. Please refer to the Current Medication list given to you today.  *If you need a refill on your cardiac medications before your next appointment, please call your pharmacy*   Lab Work: None Ordered If you have labs (blood work) drawn today and your tests are completely normal, you will receive your results only by: MyChart Message (if you have MyChart) OR A paper copy in the mail If you have any lab test that is abnormal or we need to change your treatment, we will call you to review the results.   Testing/Procedures: Your physician has requested that you have an echocardiogram. Echocardiography is a painless test that uses sound waves to create images of your heart. It provides your doctor with information about the size and shape of your heart and how well your heart's chambers and valves are working. This procedure takes approximately one hour. There are no restrictions for this procedure.    Follow-Up: At [facility], you and your health needs are our priority.  As part of our continuing mission to provide you with exceptional heart care, we have created designated Provider Care Teams.  These Care Teams include your primary Cardiologist (physician) and Advanced Practice Providers (APPs -  Physician Assistants and Nurse Practitioners) who all work together to provide you with the care you need, when you need it.  We recommend signing up for the patient portal called "MyChart".  Sign up information is provided on this After Visit Summary.  MyChart is used to connect with patients for Virtual Visits (Telemedicine).  Patients are able to view lab/test results, encounter notes, upcoming appointments, etc.  Non-urgent messages can be sent to your provider as well.   To learn more about what you can do with MyChart, go to [email or web address].    Your next appointment:   4  month(s)  The format for your next appointment:   In Person  Provider:   Robert Krasowski, MD    Other Instructions NA  

## 2022-03-04 NOTE — Progress Notes (Signed)
? ?Cardiology Consultation:   ? ?Date:  03/04/2022  ? ?ID:  Maria Morales, DOB [birth year], MRN [id number] ? ?PCP:  Patient, No Pcp Per (Inactive)  ?Cardiologist:  Gypsy Balsam, MD  ? ?Referring MD: Lars Mage, NP  ? ?No chief complaint on file. ?Heart palpitations ? ?History of Present Illness:   ? ?Maria Morales is a 31 y.o. female who is being seen today for the evaluation of palpitations at the request of Tetter, Devin B, NP.  About a month ago she started experiencing episodes of palpitations and dizziness it is interesting.  She had a she was lying down in the bed with looking at her phone and then suddenly started feeling warm like heart surgery spitting up she became dizzy she never passed out that sensation lasted few minutes went away she had this episode few times and last episode that she had happened when she was actually sitting at the desk at the computer at work and started feeling the sensation she also noted her heart speeding up.  She eventually end up going to the emergency room evaluation the emergency room showed no evidence of MI no evidence of PE.  Her EKG shows some sinus tachycardia after that she had monitoring done by primary care physician there were 4 triggered event showing normal sinus rhythm with sinus tachycardia and no abnormal rhythm since that time she stopped drinking coffee she drinks sweet tea she said palpitations subsided significantly.  Denies have any chest pain tightness squeezing pressure burning chest no swelling of lower extremities ? ?Past Medical History:  ?Diagnosis Date  ? Anxiety disorder   ? Cardiac arrhythmia   ? GERD without esophagitis   ? Medical history non-contributory   ? ? ?Past Surgical History:  ?Procedure Laterality Date  ? TYMPANOSTOMY TUBE PLACEMENT    ? ? ?Current Medications: ?Current Meds  ?Medication Sig  ? Vitamin D, Cholecalciferol, 25 MCG (1000 UT) CAPS Take 1 tablet by mouth daily.  ?  ? ?Allergies:   Patient has no known allergies.   ? ?Social History  ? ?Socioeconomic History  ? Marital status: Married  ?  Spouse name: Not on file  ? Number of children: Not on file  ? Years of education: Not on file  ? Highest education level: Not on file  ?Occupational History  ? Not on file  ?Tobacco Use  ? Smoking status: Never  ? Smokeless tobacco: Never  ?Substance and Sexual Activity  ? Alcohol use: No  ? Drug use: No  ? Sexual activity: Not Currently  ?  Birth control/protection: None  ?Other Topics Concern  ? Not on file  ?Social History Narrative  ? Not on file  ? ?Social Determinants of Health  ? ?Financial Resource Strain: Not on file  ?Food Insecurity: Not on file  ?Transportation Needs: Not on file  ?Physical Activity: Not on file  ?Stress: Not on file  ?Social Connections: Not on file  ?  ? ?Family History: ?The patient's family history includes Hypertension in her father. ?ROS:   ?Please see the history of present illness.    ?All 14 point review of systems negative except as described per history of present illness. ? ?EKGs/Labs/Other Studies Reviewed:   ? ?The following studies were reviewed today: ?I did review a monitor which showed normal sinus rhythm low heart rate was 50 average 77 maximum 135 there were no episode of atrial fibrillation  ? ?EKG:  EKG is  ordered today.  The ekg ordered  today demonstrates normal sinus rhythm with sinus arrhythmia, normal QS complex duration fulgent no ST segment changes ? ?Recent Labs: ?01/17/2022: ALT 25; BUN 8; Creatinine, Ser 0.60; Hemoglobin 13.1; Platelets 251; Potassium 3.7; Sodium 137  ?Recent Lipid Panel ?No results found for: CHOL, TRIG, HDL, CHOLHDL, VLDL, LDLCALC, LDLDIRECT ? ?Physical Exam:   ? ?VS:  BP 100/70 (BP Location: Left Arm, Patient Position: Sitting)   Pulse 73   Ht 5\' 4"  (1.626 m)   Wt 157 lb 3.2 oz (71.3 kg)   SpO2 99%   BMI 26.98 kg/m?    ? ?Wt Readings from Last 3 Encounters:  ?03/04/22 157 lb 3.2 oz (71.3 kg)  ?01/16/22 150 lb (68 kg)  ?01/17/22 150 lb (68 kg)  ?  ? ?GEN:   Well nourished, well developed in no acute distress ?HEENT: Normal ?NECK: No JVD; No carotid bruits ?LYMPHATICS: No lymphadenopathy ?CARDIAC: RRR, no murmurs, no rubs, no gallops ?RESPIRATORY:  Clear to auscultation without rales, wheezing or rhonchi  ?ABDOMEN: Soft, non-tender, non-distended ?MUSCULOSKELETAL:  No edema; No deformity  ?SKIN: Warm and dry ?NEUROLOGIC:  Alert and oriented x 3 ?PSYCHIATRIC:  Normal affect  ? ?ASSESSMENT:   ? ?1. Palpitations   ?2. Dizziness   ?3. Dyspnea on exertion   ? ?PLAN:   ? ?In order of problems listed above: ? ?Palpitations.  Does a much better right now.  I encouraged her to drink plenty of fluids I will schedule her to have echocardiogram make sure that heart is structurally normal.  To have no evidence of atrial fibrillation.  May be some component of anxiety here.  Because of blood pressure being on the lower side I am reluctant to give her any medication like a beta-blocker I simply encouraged her plenty of fluids as well as encouraged her to exercise on the regular basis ?Dizziness much better since palpitations subsided. ?Dyspnea on exertion echocardiogram will be done ? ? ?Medication Adjustments/Labs and Tests Ordered: ?Current medicines are reviewed at length with the patient today.  Concerns regarding medicines are outlined above.  ?No orders of the defined types were placed in this encounter. ? ?No orders of the defined types were placed in this encounter. ? ? ?Signed, ?01/19/22, MD, [facility]. ?03/04/2022 3:24 PM    ?[facility] ?

## 2022-03-05 ENCOUNTER — Other Ambulatory Visit: Payer: BC Managed Care – PPO

## 2022-03-11 ENCOUNTER — Ambulatory Visit (INDEPENDENT_AMBULATORY_CARE_PROVIDER_SITE_OTHER): Payer: BC Managed Care – PPO

## 2022-03-11 DIAGNOSIS — R002 Palpitations: Secondary | ICD-10-CM

## 2022-03-11 DIAGNOSIS — R0609 Other forms of dyspnea: Secondary | ICD-10-CM

## 2022-03-11 DIAGNOSIS — R42 Dizziness and giddiness: Secondary | ICD-10-CM | POA: Diagnosis not present

## 2022-03-12 LAB — ECHOCARDIOGRAM COMPLETE
Area-P 1/2: 3.21 cm2
S' Lateral: 2.7 cm

## 2022-03-17 ENCOUNTER — Telehealth: Payer: Self-pay

## 2022-03-17 NOTE — Telephone Encounter (Signed)
Patient notified of results.

## 2022-03-17 NOTE — Telephone Encounter (Signed)
-----   Message from Georgeanna Lea, MD sent at 03/13/2022 12:35 PM EDT ----- ?Echocardiogram showed normal left ventricle ejection fraction, overall looks good ?

## 2022-07-14 ENCOUNTER — Ambulatory Visit: Payer: Self-pay | Attending: Cardiology | Admitting: Cardiology

## 2024-04-22 ENCOUNTER — Other Ambulatory Visit: Payer: Self-pay

## 2024-04-22 ENCOUNTER — Encounter (HOSPITAL_COMMUNITY): Payer: Self-pay

## 2024-04-22 ENCOUNTER — Emergency Department (HOSPITAL_COMMUNITY)

## 2024-04-22 ENCOUNTER — Emergency Department (HOSPITAL_COMMUNITY)
Admission: EM | Admit: 2024-04-22 | Discharge: 2024-04-22 | Disposition: A | Discharge status: Home / Self Care | Attending: Student | Admitting: Student

## 2024-04-22 DIAGNOSIS — R109 Unspecified abdominal pain: Secondary | ICD-10-CM

## 2024-04-22 DIAGNOSIS — R1032 Left lower quadrant pain: Secondary | ICD-10-CM | POA: Diagnosis present

## 2024-04-22 LAB — URINALYSIS, ROUTINE W REFLEX MICROSCOPIC
Bilirubin Urine: NEGATIVE
Glucose, UA: NEGATIVE mg/dL
Ketones, ur: NEGATIVE mg/dL
Nitrite: NEGATIVE
Protein, ur: NEGATIVE mg/dL
Specific Gravity, Urine: 1.003 — ABNORMAL LOW (ref 1.005–1.030)
pH: 7 (ref 5.0–8.0)

## 2024-04-22 LAB — CBC
HCT: 41.7 % (ref 36.0–46.0)
Hemoglobin: 14.1 g/dL (ref 12.0–15.0)
MCH: 30.3 pg (ref 26.0–34.0)
MCHC: 33.8 g/dL (ref 30.0–36.0)
MCV: 89.7 fL (ref 80.0–100.0)
Platelets: 251 10*3/uL (ref 150–400)
RBC: 4.65 MIL/uL (ref 3.87–5.11)
RDW: 12.8 % (ref 11.5–15.5)
WBC: 7.6 10*3/uL (ref 4.0–10.5)
nRBC: 0 % (ref 0.0–0.2)

## 2024-04-22 LAB — COMPREHENSIVE METABOLIC PANEL WITH GFR
ALT: 15 U/L (ref 0–44)
AST: 29 U/L (ref 15–41)
Albumin: 3.9 g/dL (ref 3.5–5.0)
Alkaline Phosphatase: 40 U/L (ref 38–126)
Anion gap: 8 (ref 5–15)
BUN: 6 mg/dL (ref 6–20)
CO2: 25 mmol/L (ref 22–32)
Calcium: 9.6 mg/dL (ref 8.9–10.3)
Chloride: 104 mmol/L (ref 98–111)
Creatinine, Ser: 0.78 mg/dL (ref 0.44–1.00)
GFR, Estimated: >60 mL/min (ref >60)
Glucose, Bld: 93 mg/dL (ref 70–99)
Potassium: 4.3 mmol/L (ref 3.5–5.1)
Sodium: 137 mmol/L (ref 135–145)
Total Bilirubin: 1.6 mg/dL — ABNORMAL HIGH (ref 0.0–1.2)
Total Protein: 7.1 g/dL (ref 6.5–8.1)

## 2024-04-22 LAB — HCG, SERUM, QUALITATIVE: Preg, Serum: NEGATIVE

## 2024-04-22 LAB — LIPASE, BLOOD: Lipase: 30 U/L (ref 11–51)

## 2024-04-22 NOTE — ED Notes (Signed)
 This RN reviewed discharge instructions with patient. She verbalized understanding and denied any further questions. PT well appearing upon discharge and reports tolerable pain. Pt ambulated with stable gait to exit. Pt endorses ride home.

## 2024-04-22 NOTE — ED Provider Triage Note (Signed)
 Emergency Medicine Provider Triage Evaluation Note  Maria Morales , a 33 y.o. female  was evaluated in triage.  Pt complains of lower abdominal pain that has been present since approximately 8 AM this morning.  She has a history of endometriosis and endometrioma, IUD placement 2 months ago for the same which initially has improved her symptoms.  She denies any nausea or vomiting at present and states that since onset at 8 a.m. this morning, her pain is actually improved.  Review of Systems  Positive: Lower abdominal discomfort Negative: Nausea/vomiting, generalized weakness, dizziness/vertigo.  Physical Exam  BP (!) 116/93   Pulse 99   Temp 97.9 F (36.6 C)   Resp 16   Ht 5' 6 (1.676 m)   Wt 67.1 kg   LMP 04/02/2024   SpO2 100%   BMI 23.89 kg/m  Gen:   Awake, no distress   Resp:  Normal effort  MSK:   Moves extremities without difficulty  Other:  Abdomen is normal-appearing, nontender to palpation.  Medical Decision Making  Medically screening exam initiated at 11:43 AM.  Appropriate orders placed.  Andrya Roppolo was informed that the remainder of the evaluation will be completed by another provider, this initial triage assessment does not replace that evaluation, and the importance of remaining in the ED until their evaluation is complete.  Based on evaluation this patient, will place orders for gynecologic ultrasound imaging to evaluate for presence of endometrioma, other GYN related etiology.  Basic lab orders placed.   Myriam Dorn [place], [place] 04/22/24 1153

## 2024-04-22 NOTE — ED Triage Notes (Addendum)
 Pt. Stated, Im having abdominal pain that started yesterday.Denies any N/v/D.  It seems like sometimes when I go to the bathroom to empty my bladder , when I start there is some pain but once I start its ok.

## 2024-04-22 NOTE — ED Provider Notes (Cosign Needed Addendum)
 [place] [facility] AT [facility] Provider Note   CSN: [id number] Arrival date & time: 04/22/24  1049     Patient presents with: Abdominal Pain  HPI Maria Morales is a 33 y.o. female with history of a right ovarian cyst and endometrioma presenting for abdominal pain.  Started yesterday morning as she woke from sleep.  Located in the left lower quadrant.  Denies associated nausea vomiting and diarrhea.  Denies associated urinary symptoms and abnormal vaginal discharge or bleeding.  She states at this time she has a mild amount of discomfort in her lower abdomen but pain has improved considerably.  She denies any flank pain.  Last menstrual period was June 1.  Denies urinary symptoms.    Abdominal Pain      Prior to Admission medications   Medication Sig Start Date End Date Taking? Authorizing Provider  hydrOXYzine (ATARAX) 25 MG tablet Take 12.5-25 mg by mouth daily. 01/30/22   [provider]  Vitamin D, Cholecalciferol, 25 MCG (1000 UT) CAPS Take 1 tablet by mouth daily.    [provider]    Allergies: Patient has no known allergies.    Review of Systems  Gastrointestinal:  Positive for abdominal pain.    Updated Vital Signs BP (!) 116/93   Pulse 99   Temp 97.9 F (36.6 C)   Resp 16   Ht 5' 6 (1.676 m)   Wt 67.1 kg   LMP 04/02/2024   SpO2 100%   BMI 23.89 kg/m   Physical Exam Vitals and nursing note reviewed.  HENT:     Head: Normocephalic and atraumatic.     Mouth/Throat:     Mouth: Mucous membranes are moist.   Eyes:     General:        Right eye: No discharge.        Left eye: No discharge.     Conjunctiva/sclera: Conjunctivae normal.    Cardiovascular:     Rate and Rhythm: Normal rate and regular rhythm.     Pulses: Normal pulses.     Heart sounds: Normal heart sounds.  Pulmonary:     Effort: Pulmonary effort is normal.     Breath sounds: Normal breath sounds.  Abdominal:     General: Abdomen is flat.  There is no distension.     Palpations: Abdomen is soft.     Tenderness: There is no abdominal tenderness.   Skin:    General: Skin is warm and dry.   Neurological:     General: No focal deficit present.   Psychiatric:        Mood and Affect: Mood normal.     (all labs ordered are listed, but only abnormal results are displayed) Labs Reviewed  COMPREHENSIVE METABOLIC PANEL WITH GFR - Abnormal; Notable for the following components:      Result Value   Total Bilirubin 1.6 (*)    All other components within normal limits  URINALYSIS, ROUTINE W REFLEX MICROSCOPIC - Abnormal; Notable for the following components:   Color, Urine STRAW (*)    APPearance HAZY (*)    Specific Gravity, Urine 1.003 (*)    Hgb urine dipstick SMALL (*)    Leukocytes,Ua LARGE (*)    Bacteria, UA MANY (*)    All other components within normal limits  LIPASE, BLOOD  CBC  HCG, SERUM, QUALITATIVE    EKG: None  Radiology: US  PELVIC COMPLETE W TRANSVAGINAL AND TORSION R/O Result Date: 04/22/2024 CLINICAL DATA:  History  of endometriosis and right ovarian cyst with pelvic pain EXAM: TRANSABDOMINAL AND TRANSVAGINAL ULTRASOUND OF PELVIS DOPPLER ULTRASOUND OF OVARIES TECHNIQUE: Both transabdominal and transvaginal ultrasound examinations of the pelvis were performed. Transabdominal technique was performed for global imaging of the pelvis including uterus, ovaries, adnexal regions, and pelvic cul-de-sac. It was necessary to proceed with endovaginal exam following the transabdominal exam to visualize the pelvic structures. Color and duplex Doppler ultrasound was utilized to evaluate blood flow to the ovaries. COMPARISON:  MR pelvis dated 03/02/2023 FINDINGS: Uterus Measurements: 10.0 cm in sagittal dimension. No fibroids or other mass visualized. Endometrium Thickness: 5 mm. No focal abnormality visualized. Intrauterine device within the endometrial canal. Right ovary Measurements: 5.6 x 5.6 x 4.6 cm = volume: 74.9 mL.  Contains a mildly hypoechoic cystic focus measuring 4.9 x 4.9 x 4.5 cm, previously characterized as an endometrioma and measuring 6.0 cm. Again seen are multiple peripheral septation/nodularities. Left ovary Measurements: 3.5 x 2.7 x 2.2 cm = volume: 10.5 mL. Normal appearance. No adnexal mass. Pulsed Doppler evaluation of both ovaries demonstrates normal low-resistance arterial and venous waveforms. Other findings Small pelvic free fluid. IMPRESSION: 1. No evidence of ovarian torsion. 2. Slightly decreased size of right ovarian endometrioma measuring 4.9 cm, previously 6.0 cm. Peripheral septation/nodularities, likely endometriotic components. Malignant degeneration is considered less likely. 3. Intrauterine device within the endometrial canal. 4. Small pelvic free fluid. Electronically Signed   By: Limin  Xu M.D.   On: 04/22/2024 13:57     Procedures   Medications Ordered in the ED - No data to display                                  Medical Decision Making Amount and/or Complexity of Data Reviewed Labs: ordered.   Initial Impression and Ddx 33 year old well-appearing female presenting for abdominal pain.  Exam was unremarkable and abdomen is soft, nontender nondistended.  DDx includes ectopic pregnancy, ovarian torsion, mass effect from endometrioma, appendicitis, UTI, kidney stone, diverticulitis, other. Patient PMH that increases complexity of ED encounter:  history of a right ovarian cyst and endometrioma  Interpretation of Diagnostics - I independent reviewed and interpreted the labs as followed: Hematuria, bacteriuria  - I independently visualized the following imaging with scope of interpretation limited to determining acute life threatening conditions related to emergency care: Transvaginal ultrasound, which revealed slightly decreased endometrioma.  Shared findings with patient.  Patient Reassessment and Ultimate Disposition/Management On reassessment she remained without  abdominal pain and was nontender.  Workup was overall reassuring.  Had a shared decision discussion regarding CT for possible kidney stone and/or ascending UTI but feel unlikely given she is not having urinary symptoms at this time and does not have flank pain, afebrile and no white blood cell count and normal renal function per labs.  She declined antibiotics and CT scan which I thought was appropriate.  Fluid challenge with no issue.  Advised her to follow-up with her OB/GYN provider.  Discussed return precautions.  Discharged good condition.  Patient management required discussion with the following services or consulting groups:  None  Complexity of Problems Addressed Acute complicated illness or Injury  Additional Data Reviewed and Analyzed Further history obtained from: Past medical history and medications listed in the EMR and Prior ED visit notes  Patient Encounter Risk Assessment Consideration of hospitalization      Final diagnoses:  Abdominal pain, unspecified abdominal location    ED Discharge Orders  None          Lang Norleen POUR, PA-C 04/22/24 1419

## 2024-04-22 NOTE — Discharge Instructions (Signed)
 Evaluation today revealed a endometrioma that is decreased in size.  However do feel that you should follow-up with your OB/GYN provider.  Otherwise your labs and overall workup were very reassuring.  If you develop worsening abdominal pain, nausea and vomiting, pain with urination, blood in the urine, abnormal vaginal bleeding or discharge or any other concerning symptom please return to the ED for further evaluation.

## 2024-05-19 ENCOUNTER — Encounter: Payer: Self-pay | Admitting: Advanced Practice Midwife
